# Patient Record
Sex: Female | Born: 1940
Health system: Southern US, Community
[De-identification: ages and names within clinical notes are randomized; demographics above are authoritative.]

## PROBLEM LIST (undated history)

## (undated) DIAGNOSIS — T7840XA Allergy, unspecified, initial encounter: Secondary | ICD-10-CM

## (undated) DIAGNOSIS — H269 Unspecified cataract: Secondary | ICD-10-CM

## (undated) DIAGNOSIS — M1990 Unspecified osteoarthritis, unspecified site: Secondary | ICD-10-CM

## (undated) DIAGNOSIS — Z86010 Personal history of colonic polyps: Secondary | ICD-10-CM

## (undated) DIAGNOSIS — L659 Nonscarring hair loss, unspecified: Secondary | ICD-10-CM

## (undated) DIAGNOSIS — M549 Dorsalgia, unspecified: Secondary | ICD-10-CM

## (undated) DIAGNOSIS — E785 Hyperlipidemia, unspecified: Secondary | ICD-10-CM

## (undated) HISTORY — DX: Dorsalgia, unspecified: M54.9

## (undated) HISTORY — DX: Allergy, unspecified, initial encounter: T78.40XA

## (undated) HISTORY — DX: Unspecified cataract: H26.9

## (undated) HISTORY — DX: Nonscarring hair loss, unspecified: L65.9

## (undated) HISTORY — DX: Unspecified osteoarthritis, unspecified site: M19.90

## (undated) HISTORY — DX: Hyperlipidemia, unspecified: E78.5

## (undated) HISTORY — DX: Personal history of colonic polyps: Z86.010

## (undated) HISTORY — PX: OTHER SURGICAL HISTORY: SHX169

## (undated) HISTORY — PX: WISDOM TOOTH EXTRACTION: SHX21

---

## 2000-04-18 ENCOUNTER — Other Ambulatory Visit: Admission: RE | Admit: 2000-04-18 | Discharge: 2000-04-18 | Payer: Self-pay | Admitting: Gynecology

## 2001-04-15 ENCOUNTER — Other Ambulatory Visit: Admission: RE | Admit: 2001-04-15 | Discharge: 2001-04-15 | Payer: Self-pay | Admitting: Obstetrics and Gynecology

## 2003-06-18 ENCOUNTER — Other Ambulatory Visit: Admission: RE | Admit: 2003-06-18 | Discharge: 2003-06-18 | Payer: Self-pay | Admitting: Obstetrics and Gynecology

## 2004-07-04 ENCOUNTER — Other Ambulatory Visit: Admission: RE | Admit: 2004-07-04 | Discharge: 2004-07-04 | Payer: Self-pay | Admitting: Obstetrics and Gynecology

## 2006-03-12 ENCOUNTER — Ambulatory Visit: Payer: Self-pay | Admitting: Family Medicine

## 2006-03-12 ENCOUNTER — Encounter (INDEPENDENT_AMBULATORY_CARE_PROVIDER_SITE_OTHER): Payer: Self-pay | Admitting: Specialist

## 2006-03-12 ENCOUNTER — Other Ambulatory Visit: Admission: RE | Admit: 2006-03-12 | Discharge: 2006-03-12 | Payer: Self-pay | Admitting: Family Medicine

## 2006-05-10 ENCOUNTER — Ambulatory Visit: Payer: Self-pay | Admitting: Family Medicine

## 2007-04-17 ENCOUNTER — Other Ambulatory Visit: Admission: RE | Admit: 2007-04-17 | Discharge: 2007-04-17 | Payer: Self-pay | Admitting: Gynecology

## 2008-09-23 ENCOUNTER — Ambulatory Visit: Payer: Self-pay | Admitting: Family Medicine

## 2008-09-23 DIAGNOSIS — M545 Low back pain, unspecified: Secondary | ICD-10-CM | POA: Insufficient documentation

## 2008-09-28 ENCOUNTER — Encounter: Admission: RE | Admit: 2008-09-28 | Discharge: 2008-09-28 | Payer: Self-pay | Admitting: Family Medicine

## 2010-11-13 ENCOUNTER — Encounter: Payer: Self-pay | Admitting: Family Medicine

## 2011-01-09 ENCOUNTER — Encounter: Payer: Self-pay | Admitting: Family Medicine

## 2011-01-10 ENCOUNTER — Encounter: Payer: Self-pay | Admitting: Family Medicine

## 2011-01-24 ENCOUNTER — Other Ambulatory Visit: Payer: Medicare Other | Admitting: Family Medicine

## 2011-01-31 ENCOUNTER — Ambulatory Visit (INDEPENDENT_AMBULATORY_CARE_PROVIDER_SITE_OTHER): Payer: Medicare Other | Admitting: Family Medicine

## 2011-01-31 ENCOUNTER — Encounter: Payer: Self-pay | Admitting: Family Medicine

## 2011-01-31 DIAGNOSIS — Z0000 Encounter for general adult medical examination without abnormal findings: Secondary | ICD-10-CM

## 2011-01-31 DIAGNOSIS — L259 Unspecified contact dermatitis, unspecified cause: Secondary | ICD-10-CM

## 2011-01-31 DIAGNOSIS — E785 Hyperlipidemia, unspecified: Secondary | ICD-10-CM

## 2011-01-31 LAB — CBC WITH DIFFERENTIAL/PLATELET
Basophils Absolute: 0 10*3/uL (ref 0.0–0.1)
Basophils Relative: 0.3 % (ref 0.0–3.0)
Eosinophils Absolute: 0 10*3/uL (ref 0.0–0.7)
MCHC: 34.4 g/dL (ref 30.0–36.0)
MCV: 92.9 fl (ref 78.0–100.0)
Monocytes Absolute: 0.5 10*3/uL (ref 0.1–1.0)
Neutrophils Relative %: 75.4 % (ref 43.0–77.0)
Platelets: 295 10*3/uL (ref 150.0–400.0)
RDW: 13.4 % (ref 11.5–14.6)

## 2011-01-31 LAB — HEPATIC FUNCTION PANEL
ALT: 16 U/L (ref 0–35)
Total Bilirubin: 1.1 mg/dL (ref 0.3–1.2)
Total Protein: 6.9 g/dL (ref 6.0–8.3)

## 2011-01-31 LAB — TSH: TSH: 2.35 u[IU]/mL (ref 0.35–5.50)

## 2011-01-31 LAB — POCT URINALYSIS DIPSTICK
Bilirubin, UA: NEGATIVE
Blood, UA: NEGATIVE
Glucose, UA: NEGATIVE
Nitrite, UA: NEGATIVE
Spec Grav, UA: 1.025
pH, UA: 5

## 2011-01-31 LAB — BASIC METABOLIC PANEL
BUN: 14 mg/dL (ref 6–23)
Calcium: 9.6 mg/dL (ref 8.4–10.5)
GFR: 79.34 mL/min (ref >60.00)
Glucose, Bld: 91 mg/dL (ref 70–99)
Potassium: 4.6 mEq/L (ref 3.5–5.1)
Sodium: 142 mEq/L (ref 135–145)

## 2011-01-31 LAB — LIPID PANEL
HDL: 82.3 mg/dL (ref >39.00)
Total CHOL/HDL Ratio: 3
VLDL: 16.2 mg/dL (ref 0.0–40.0)

## 2011-01-31 LAB — LDL CHOLESTEROL, DIRECT: Direct LDL: 161.3 mg/dL

## 2011-01-31 NOTE — Progress Notes (Signed)
  Subjective:    Patient ID: Tracy Zamora, female    DOB: September 30, 1941, 70 y.o.   MRN: [id number]  HPI 70 yr old female for a cpx. She has a few concerns. She has noticed some thinning of her hair, and she has had an itching rash behind both ears. She exercises regularly and tries to eat a healthy diet. No chest pain or SOB. No recent weight or appetite changes. She has not had a mammogram for several years.    Review of Systems  Constitutional: Negative.   HENT: Negative.   Eyes: Negative.   Respiratory: Negative.   Cardiovascular: Negative.   Gastrointestinal: Negative.   Genitourinary: Negative for dysuria, urgency, frequency, hematuria, flank pain, decreased urine volume, enuresis, difficulty urinating, pelvic pain and dyspareunia.  Musculoskeletal: Negative.   Skin: Negative.   Neurological: Negative.   Hematological: Negative.   Psychiatric/Behavioral: Negative.        Objective:   Physical Exam  Constitutional: She is oriented to person, place, and time. She appears well-developed and well-nourished. No distress.  HENT:  Head: Normocephalic and atraumatic.  Right Ear: External ear normal.  Left Ear: External ear normal.  Nose: Nose normal.  Mouth/Throat: Oropharynx is clear and moist. No oropharyngeal exudate.  Eyes: Conjunctivae and EOM are normal. Pupils are equal, round, and reactive to light. No scleral icterus.  Neck: Normal range of motion. Neck supple. No JVD present. No thyromegaly present.  Cardiovascular: Normal rate, regular rhythm, normal heart sounds and intact distal pulses.  Exam reveals no gallop and no friction rub.   No murmur heard.      EKG normal   Pulmonary/Chest: Effort normal and breath sounds normal. No respiratory distress. She has no wheezes. She has no rales. She exhibits no tenderness.  Abdominal: Soft. Bowel sounds are normal. She exhibits no distension and no mass. There is no tenderness. There is no rebound and no guarding.  Genitourinary:  No breast swelling, tenderness, discharge or bleeding.  Musculoskeletal: Normal range of motion. She exhibits no edema and no tenderness.  Lymphadenopathy:    She has no cervical adenopathy.  Neurological: She is alert and oriented to person, place, and time. She has normal reflexes. No cranial nerve deficit. She exhibits normal muscle tone. Coordination normal.  Skin: Skin is warm and dry. Rash noted. No erythema. No pallor.       The crease behind both ear lobes is red and scaly   Psychiatric: She has a normal mood and affect. Her behavior is normal. Judgment and thought content normal.          Assessment & Plan:  Get fasting labs. She has eczema, so I suggested she apply 1% hydrocortisone cream to these areas bid .

## 2011-02-01 LAB — VITAMIN D 25 HYDROXY (VIT D DEFICIENCY, FRACTURES): Vit D, 25-Hydroxy: 84 ng/mL (ref 30–89)

## 2011-02-06 ENCOUNTER — Telehealth: Payer: Self-pay | Admitting: Family Medicine

## 2011-02-06 NOTE — Telephone Encounter (Signed)
Dr Clent Ridges has not reviewed labs yet.

## 2011-02-06 NOTE — Telephone Encounter (Signed)
Pt needs blood work results °

## 2011-02-07 ENCOUNTER — Telehealth: Payer: Self-pay

## 2011-02-07 NOTE — Telephone Encounter (Signed)
See the report. 

## 2011-02-07 NOTE — Telephone Encounter (Signed)
Pt aware.

## 2011-02-07 NOTE — Telephone Encounter (Signed)
Message copied by Madison Hickman on Tue Feb 07, 2011  8:38 AM ------      Message from: Dwaine Deter      Created: Tue Feb 07, 2011  6:12 AM       Normal except for high chol. Watch the diet closely

## 2017-03-08 DIAGNOSIS — H52223 Regular astigmatism, bilateral: Secondary | ICD-10-CM | POA: Diagnosis not present

## 2017-03-08 DIAGNOSIS — H2513 Age-related nuclear cataract, bilateral: Secondary | ICD-10-CM | POA: Diagnosis not present

## 2017-03-08 DIAGNOSIS — H5213 Myopia, bilateral: Secondary | ICD-10-CM | POA: Diagnosis not present

## 2017-11-23 DIAGNOSIS — R1013 Epigastric pain: Secondary | ICD-10-CM | POA: Diagnosis not present

## 2017-11-23 DIAGNOSIS — R1032 Left lower quadrant pain: Secondary | ICD-10-CM | POA: Diagnosis not present

## 2017-11-23 DIAGNOSIS — R109 Unspecified abdominal pain: Secondary | ICD-10-CM | POA: Diagnosis not present

## 2017-11-27 ENCOUNTER — Encounter: Payer: Self-pay | Admitting: Family Medicine

## 2017-11-27 ENCOUNTER — Encounter: Payer: Self-pay | Admitting: Internal Medicine

## 2017-11-27 ENCOUNTER — Ambulatory Visit (INDEPENDENT_AMBULATORY_CARE_PROVIDER_SITE_OTHER): Payer: Medicare Other | Admitting: Family Medicine

## 2017-11-27 VITALS — BP 110/62 | HR 66 | Temp 98.3°F | Wt 101.4 lb

## 2017-11-27 DIAGNOSIS — R1010 Upper abdominal pain, unspecified: Secondary | ICD-10-CM | POA: Diagnosis not present

## 2017-11-27 NOTE — Progress Notes (Signed)
   Subjective:    Patient ID: Tracy Zamora, female    DOB: 04-11-1941, 77 y.o.   MRN: [id number]  HPI Here to follow up an ER visit on 11-23-17 in [place], [place]. While on vacation she woke up early that morning with the sudden onset of severe epigastric pain that then generalized over the entire  abdomen over the course of several hours. The epigastric area was always the most involved. She had normal BMs and urinations prior to that. She was nauseated but did not vomit. No fevers. At the ER her WBC was slightly elevated at 11.09 but there was no left shift. All other labs were normal. They bring copies of these results with them today. She had an abdominal CT scan as well (though we do not have a copy of that today) and she was told she had some diverticulitis. She was given IV morphine, ondansetron, and levofloxacin. She was sent out on Cipro 500 mg bid and Metronidazole 500 mg bid. Since then she has had no pain whatsoever. She still has some slight nausea but has never vomited. Still no fevers. She has developed some diarrhea in the past 24 hours and this has had a dark or black color. She is convinced the diarrhea is a side effect of the antibiotics, so she has not taken any yet today. She has never had a colonoscopy. She is eating and drinking normally.    Review of Systems  Constitutional: Negative.   Respiratory: Negative.   Cardiovascular: Negative.   Gastrointestinal: Positive for diarrhea and nausea. Negative for abdominal distention, anal bleeding, constipation and vomiting.  Genitourinary: Negative.        Objective:   Physical Exam  Constitutional: She is oriented to person, place, and time. She appears well-developed and well-nourished.  She is comfortable   Cardiovascular: Normal rate, regular rhythm, normal heart sounds and intact distal pulses.  Pulmonary/Chest: Effort normal and breath sounds normal. No respiratory distress. She has no wheezes. She has no rales.  Abdominal:  Soft. Bowel sounds are normal. She exhibits no distension and no mass. There is no tenderness. There is no rebound and no guarding.  No tenderness at all   Neurological: She is alert and oriented to person, place, and time.          Assessment & Plan:  Sudden onset of upper abdominal pain which has resolved, but with the appearance now of diarrhea. The etiology is not clear, but this scenario makes me think that diverticulitis is not a likely possibility. Other etiologies such as gastric or duodenal ulcers come to mind. We agreed to stop the antibiotics for now. She will start on Omeprazole 20 mg daily. We will attempt to have the CT scan report sent to [place]. I will refer her to GI to evaluate further.  Alysia Penna, MD

## 2017-11-28 ENCOUNTER — Ambulatory Visit: Payer: Managed Care, Other (non HMO) | Admitting: Family Medicine

## 2018-01-02 ENCOUNTER — Telehealth: Payer: Self-pay

## 2018-01-02 NOTE — Telephone Encounter (Signed)
Pt came in to office to ask for a referral to Dr. Chalmers Cater for possible thyroid issues. She is aware that she has not seen you for this complaint and may require an OV to discuss.   Dr. Sarajane Jews - Please advise. Thanks!

## 2018-01-02 NOTE — Telephone Encounter (Signed)
Please make an OV with me to discuss this

## 2018-01-03 NOTE — Telephone Encounter (Signed)
Pt advised she needs OV to discuss concerns and possible referral. She states she wcb to schedule. Nothing further needed at this time.

## 2018-01-09 ENCOUNTER — Ambulatory Visit: Payer: Medicare Other | Admitting: Gastroenterology

## 2018-01-14 ENCOUNTER — Ambulatory Visit: Payer: Medicare Other | Admitting: Internal Medicine

## 2018-01-16 ENCOUNTER — Encounter: Payer: Self-pay | Admitting: Internal Medicine

## 2018-01-16 ENCOUNTER — Ambulatory Visit (INDEPENDENT_AMBULATORY_CARE_PROVIDER_SITE_OTHER): Payer: Medicare Other | Admitting: Internal Medicine

## 2018-01-16 ENCOUNTER — Encounter (INDEPENDENT_AMBULATORY_CARE_PROVIDER_SITE_OTHER): Payer: Self-pay

## 2018-01-16 VITALS — BP 126/68 | HR 76 | Ht 61.0 in | Wt 102.4 lb

## 2018-01-16 DIAGNOSIS — Z1211 Encounter for screening for malignant neoplasm of colon: Secondary | ICD-10-CM

## 2018-01-16 DIAGNOSIS — K529 Noninfective gastroenteritis and colitis, unspecified: Secondary | ICD-10-CM | POA: Diagnosis not present

## 2018-01-16 NOTE — Patient Instructions (Signed)
  You have been scheduled for a colonoscopy. Please follow written instructions given to you at your visit today.  Please pick up your prep supplies at the pharmacy. If you use inhalers (even only as needed), please bring them with you on the day of your procedure. Your physician has requested that you go to www.[facility].com and enter the access code given to you at your visit today. This web site gives a general overview about your procedure. However, you should still follow specific instructions given to you by our office regarding your preparation for the procedure.     I appreciate the opportunity to care for you. Carl Gessner, MD, FACG 

## 2018-01-16 NOTE — Progress Notes (Signed)
Tracy Zamora 77 y.o. 05-Jun-1941 [id number]  Assessment & Plan:   Encounter Diagnoses  Name Primary?  . Gastroenteritis presumed infectious - resolved Yes  . Colon cancer screening     The signs and sxs she had at the beach and afterward sound like a resolved infectious gastroenteritis most likely. Will observe for any recurrence and consider investigation (EGD possibly) depending upon what transpires.  Screening colonoscopy is appropriate and she is willing. The risks and benefits as well as alternatives of endoscopic procedure(s) have been discussed and reviewed. All questions answered. The patient agrees to proceed.  She will call back if has any recurrent GI sxs and possibly add an EGD depending upon history.  I appreciate the opportunity to care for this patient. Cc:Fry, Ishmael Holter, MD     Subjective:   Chief Complaint: Abdominal pain and diarrhea in Feb 2019  HPI Patient is here at the request of Dr. Sarajane Jews because of an episode of abdominal pain and diarrhea that occurred in February she was visiting her second home in [place].  She was doing fine and had sudden onset of upper abdominal pain crampy fairly severe went to the ER had a CT scan that showed mild small bowel stasis in the right lower quadrant no inflammation otherwise unremarkable with normal CBC CMET and UA.  I reviewed those records.  She was prescribed Cipro and metronidazole she developed dark perhaps black diarrhea she stopped the antibiotics she saw Dr. Sarajane Jews in follow-up.  She was not taking Pepto-Bismol.  Symptoms gradually abated lasted a few days she took omeprazole for 2 weeks, at the end of that 2 weeks she went out the [place] [employer] and she had an episode of regurgitation after eating a salad it lasted a few hours, that resolved.  She took omeprazole for 2 more weeks.  She has been well since.  She has occasional heartburn.  She will use an over-the-counter antacid with relief.  She says she  thinks she is making a little more acid since her husband retired and is around the house all the time.  They do get down to [facility] for the second home every 2 weeks or so and enjoyed that.  No chronic GI issues.  She has not had a screening colonoscopy before but is willing to consider one.  She wonders if she might have an ulcer. Allergies  Allergen Reactions  . Penicillins     Causes UTI and ear infections    Current Meds  Medication Sig  . Biotin 1000 MCG tablet Take 1,000 mcg by mouth daily.  . Calcium Carb-Cholecalciferol (CALCIUM 600/VITAMIN D3) 600-800 MG-UNIT TABS Take 1 tablet by mouth daily.  . Multiple Vitamin (MULTI-VITAMIN DAILY PO) Take by mouth.   Past Medical History:  Diagnosis Date  . Alopecia   . Back pain    lumbar  . Hyperlipidemia    Past Surgical History:  Procedure Laterality Date  . uterine polypectomy    . WISDOM TOOTH EXTRACTION     Social History   Social History Narrative   The patient is married, she has 3 children.  Has a second home in [place].   2 sons one daughter   Caffeine use 1 cup of coffee 2 iced teas daily   No alcohol tobacco or drug use.   family history includes Alzheimer's disease in her mother; Prostate cancer in her father.   Review of Systems As per HPI.  All  other review of systems are negative.  Objective:   Physical Exam @BP  126/68   Pulse 76   Ht 5\' 1"  (1.549 m)   Wt 102 lb 6.4 oz (46.4 kg)   BMI 19.35 kg/m @  General:  Well-developed, well-nourished and in no acute distress Eyes:  anicteric. ENT:   Mouth and posterior pharynx free of lesions.  Neck:   supple w/o thyromegaly or mass.  Lungs: Clear to auscultation bilaterally. Heart:  S1S2, no rubs, murmurs, gallops. Abdomen:  soft, non-tender, no hepatosplenomegaly, hernia, or mass and BS+.  Rectal: Deferred until colonoscopy Lymph:  no cervical or supraclavicular adenopathy. Extremities:   no edema, cyanosis or  clubbing Skin   no rash. Neuro:  A&O x 3.  Psych:  appropriate mood and  Affect.   Data Reviewed: See HPI

## 2018-03-28 ENCOUNTER — Ambulatory Visit (INDEPENDENT_AMBULATORY_CARE_PROVIDER_SITE_OTHER): Payer: Medicare Other | Admitting: Family Medicine

## 2018-03-28 ENCOUNTER — Encounter: Payer: Self-pay | Admitting: Family Medicine

## 2018-03-28 VITALS — BP 100/76 | HR 64 | Temp 98.1°F | Ht 60.5 in | Wt 100.6 lb

## 2018-03-28 DIAGNOSIS — M25551 Pain in right hip: Secondary | ICD-10-CM

## 2018-03-28 DIAGNOSIS — Z780 Asymptomatic menopausal state: Secondary | ICD-10-CM

## 2018-03-28 DIAGNOSIS — M25552 Pain in left hip: Secondary | ICD-10-CM | POA: Diagnosis not present

## 2018-03-28 DIAGNOSIS — E785 Hyperlipidemia, unspecified: Secondary | ICD-10-CM | POA: Insufficient documentation

## 2018-03-28 DIAGNOSIS — L659 Nonscarring hair loss, unspecified: Secondary | ICD-10-CM

## 2018-03-28 DIAGNOSIS — H9190 Unspecified hearing loss, unspecified ear: Secondary | ICD-10-CM

## 2018-03-28 DIAGNOSIS — M25559 Pain in unspecified hip: Secondary | ICD-10-CM | POA: Insufficient documentation

## 2018-03-28 LAB — COMPREHENSIVE METABOLIC PANEL
ALT: 15 U/L (ref 0–35)
AST: 22 U/L (ref 0–37)
Albumin: 4.5 g/dL (ref 3.5–5.2)
Alkaline Phosphatase: 59 U/L (ref 39–117)
BUN: 12 mg/dL (ref 6–23)
CALCIUM: 9.8 mg/dL (ref 8.4–10.5)
CHLORIDE: 98 meq/L (ref 96–112)
CO2: 32 meq/L (ref 19–32)
Creatinine, Ser: 0.64 mg/dL (ref 0.40–1.20)
GFR: 96.22 mL/min (ref >60.00)
Glucose, Bld: 93 mg/dL (ref 70–99)
Potassium: 5 mEq/L (ref 3.5–5.1)
Sodium: 136 mEq/L (ref 135–145)
Total Bilirubin: 0.7 mg/dL (ref 0.2–1.2)
Total Protein: 6.8 g/dL (ref 6.0–8.3)

## 2018-03-28 LAB — CBC
HEMATOCRIT: 41.6 % (ref 36.0–46.0)
Hemoglobin: 14.1 g/dL (ref 12.0–15.0)
MCHC: 33.9 g/dL (ref 30.0–36.0)
MCV: 92.2 fl (ref 78.0–100.0)
Platelets: 330 10*3/uL (ref 150.0–400.0)
RBC: 4.51 Mil/uL (ref 3.87–5.11)
RDW: 13.4 % (ref 11.5–15.5)
WBC: 7.7 10*3/uL (ref 4.0–10.5)

## 2018-03-28 LAB — LIPID PANEL
CHOL/HDL RATIO: 3
Cholesterol: 240 mg/dL — ABNORMAL HIGH (ref 0–200)
HDL: 79.9 mg/dL (ref >39.00)
LDL CALC: 148 mg/dL — AB (ref 0–99)
NonHDL: 160.03
TRIGLYCERIDES: 62 mg/dL (ref 0.0–149.0)
VLDL: 12.4 mg/dL (ref 0.0–40.0)

## 2018-03-28 LAB — TSH: TSH: 3.28 u[IU]/mL (ref 0.35–4.50)

## 2018-03-28 LAB — T4, FREE: Free T4: 0.99 ng/dL (ref 0.60–1.60)

## 2018-03-28 LAB — T3, FREE: T3 FREE: 3.1 pg/mL (ref 2.3–4.2)

## 2018-03-28 NOTE — Addendum Note (Signed)
Addended by: Rene Kocher on: 03/28/2018 01:56 PM   Modules accepted: Orders

## 2018-03-28 NOTE — Assessment & Plan Note (Addendum)
Reports issues several years 20190 never tested. Declines testing- states wouldn't want hearing aids. Has associated tinnintus

## 2018-03-28 NOTE — Assessment & Plan Note (Addendum)
S: poorly controlled on last check. Exercises 6 days a week walking an hour in park. Eat pretty clean. Also does some weights multiple times a week Lab Results  Component Value Date   CHOL 263 (H) 01/31/2011   HDL 82.30 01/31/2011   LDLDIRECT 161.3 01/31/2011   TRIG 81.0 01/31/2011   CHOLHDL 3 01/31/2011   A/P: update lipids- get 10 year risk. Has high bad cholesterol but also very good HDL levels.  She would prefer to try to tigthen up diet even if #s are high- we will give new numbers as new starting point.

## 2018-03-28 NOTE — Assessment & Plan Note (Signed)
S: has lost all hair on legs, arms, thinned eyebrows. Biotin has helped thin fingernails. Feels like hair falls out form scalp.   Also concerned mild insomnia- actually still hot flashes from menopause, occasional anxiety. Sensitive to heat and reports being irritable. High appetite A/P: she is worried about possible thyroid issues- will update TSH at her request- very reasonable

## 2018-03-28 NOTE — Patient Instructions (Addendum)
Health Maintenance Due  Topic Date Due  . TETANUS/TDAP - Informed patient to have it done at pharmacy. 07/10/1962  . MAMMOGRAM - call the breast center and get set up for mammogram- see handout 07/10/1993  . COLONOSCOPY - Scheduled for next Tuesday June 11th, 2019 07/10/1993  . DEXA SCAN - Schedule your bone density test at check out desk. You may also call directly to X-ray at [phone] to schedule an appointment that is convenient for you.  - located [address] across the street from [place] - in the basement - you do need an appointment for the bone density tests.    07/10/2008  . PNA vac Low Risk Adult (1 of 2 - PCV13) - Patient Declined. Agrees to consider for next visit 07/10/2008   I would also like for you to sign up for an annual wellness visit with one of our nurses, Cassie or Manuela Schwartz, who both specialize in the annual wellness visit. This is a free benefit under medicare that may help [place] find additional ways to help you. Some highlights are reviewing medications, lifestyle, and doing a dementia screen.   Please stop by lab before you go. Tell lab they can release all future orders to be done today since you havent had anything to eat since 5 AM

## 2018-03-28 NOTE — Progress Notes (Signed)
Your CBC was normal (blood counts, infection fighting cells, platelets). Your CMET was normal (kidney, liver, and electrolytes, blood sugar).  Your cholesterol is high. Your 10 year risk of heart attack or stroke based on the ASCVD risk calculation is about 9%. This # uses your age, blood pressure, cholesterol, and current medications to determine risk of heart attack or stroke.  No Cholesterol medicine is recommended under 5%. Between 5-7.5% it is recommended to consider cholesterol medicine and above 7.5 % it it is recommended to start statin. I usually use at least a 10-12% cut off to suggest use as I think calculator slightly overestimates risk. The mediterranean diet can help you lower your cholesterol-there is lots of good information about this online. Your thyroid was normal on the 3 different tests- tsh, t3, t4.

## 2018-03-28 NOTE — Progress Notes (Signed)
Phone: [phone]  Subjective:  Patient presents today to establish care with me as their new primary care provider. Patient was formerly a patient of Dr. Sarajane Jews. Chief complaint-noted.   See problem oriented charting ROS-hearing loss and tenderness, occasional light sensitivity, reports both hot and cold intolerance, joint pain at times, reports thinning hair and nails  The following were reviewed and entered/updated in epic: Past Medical History:  Diagnosis Date  . Alopecia   . Back pain    lumbar. slipped disc years ago  . Hyperlipidemia    Patient Active Problem List   Diagnosis Date Noted  . Alopecia 03/28/2018    Priority: Medium  . Hyperlipidemia 03/28/2018    Priority: Medium  . Hearing loss 03/28/2018    Priority: Low  . Hip pain 03/28/2018    Priority: Low   Past Surgical History:  Procedure Laterality Date  . uterine polypectomy    . WISDOM TOOTH EXTRACTION      Family History  Problem Relation Age of Onset  . Prostate cancer Father        59  . Alzheimer's disease Mother        41  . Healthy Brother     Medications- reviewed and updated Current Outpatient Medications  Medication Sig Dispense Refill  . Biotin 1000 MCG tablet Take 1,000 mcg by mouth daily.     . Calcium Carb-Cholecalciferol (CALCIUM 600/VITAMIN D3) 600-800 MG-UNIT TABS Take 1 tablet by mouth 2 (two) times daily.     . Multiple Vitamin (MULTI-VITAMIN DAILY PO) Take by mouth.     No current facility-administered medications for this visit.     Allergies-reviewed and updated Allergies  Allergen Reactions  . Penicillins     Causes UTI and ear infections     Social History   Social History Narrative   married, she has 3 children-2 sons one daughter.  6 grandkids.    She has a second home in [place].      Homemaking.       Hobbies: big exerciser   Objective: BP 100/76 (BP Location: Left Arm, Patient Position: Sitting, Cuff Size: Normal)   Pulse 64   Temp  98.1 F (36.7 C) (Oral)   Ht 5' 0.5" (1.537 m)   Wt 100 lb 9.6 oz (45.6 kg)   SpO2 99%   BMI 19.32 kg/m  Gen: NAD, resting comfortably HEENT: Mucous membranes are moist. Oropharynx normal, mild cerumen in both canals.  Thin eyebrows with minimal hair on outer half Neck: no thyromegaly CV: RRR no murmurs rubs or gallops Lungs: CTAB no crackles, wheeze, rhonchi Abdomen: soft/nontender/nondistended/normal bowel sounds. No rebound or guarding.  Ext: no edema, no hair on extremities Skin: warm, dry Neuro: grossly normal, moves all extremities, PERRLA  Assessment/Plan:  Alopecia S: has lost all hair on legs, arms, thinned eyebrows. Biotin has helped thin fingernails. Feels like hair falls out form scalp.   Also concerned mild insomnia- actually still hot flashes from menopause, occasional anxiety. Sensitive to heat and reports being irritable. High appetite A/P: she is worried about possible thyroid issues- will update TSH at her request- very reasonable  Hyperlipidemia S: poorly controlled on last check. Exercises 6 days a week walking an hour in park. Eat pretty clean. Also does some weights multiple times a week Lab Results  Component Value Date   CHOL 263 (H) 01/31/2011   HDL 82.30 01/31/2011   LDLDIRECT 161.3 01/31/2011   TRIG 81.0 01/31/2011   CHOLHDL 3 01/31/2011  A/P: update lipids- get 10 year risk. Has high bad cholesterol but also very good HDL levels.  She would prefer to try to tigthen up diet even if #s are high- we will give new numbers as new starting point.   Hearing loss Reports issues several years 20190 never tested. Declines testing- states wouldn't want hearing aids. Has associated tinnintus   Future Appointments  Date Time Provider [place]  04/02/2018  8:00 AM Gatha Mayer, MD [facility]   Return in about 1 year (around 03/29/2019) for follow up- or sooner if needed.  Lab/Order associations: last food 5 am so over 8 hours fasting.    Alopecia - Plan: TSH, T3, free, T4, free  Hyperlipidemia, unspecified hyperlipidemia type - Plan: CBC, Comprehensive metabolic panel, Lipid panel  Hearing loss, unspecified hearing loss type, unspecified laterality  Postmenopausal - Plan: DG Bone Density  Time Stamp The duration of face-to-face time during this visit was greater than 25 minutes. Greater than 50% of this time was spent in counseling, explanation of diagnosis, planning of further management, and/or coordination of care including primarily health maintenance discussions including importance of mammogram, colonoscopy-thankful she is signed out, bone density, immunizations-she declined all immunizations at this time though..    Return precautions advised.  Garret Reddish, MD

## 2018-04-01 ENCOUNTER — Encounter: Payer: Self-pay | Admitting: Family Medicine

## 2018-04-02 ENCOUNTER — Encounter: Payer: Self-pay | Admitting: Internal Medicine

## 2018-04-02 ENCOUNTER — Ambulatory Visit (AMBULATORY_SURGERY_CENTER): Payer: Medicare Other | Admitting: Internal Medicine

## 2018-04-02 ENCOUNTER — Other Ambulatory Visit: Payer: Self-pay

## 2018-04-02 VITALS — BP 89/56 | HR 63 | Temp 98.9°F | Resp 20 | Ht 61.0 in | Wt 102.0 lb

## 2018-04-02 DIAGNOSIS — D123 Benign neoplasm of transverse colon: Secondary | ICD-10-CM

## 2018-04-02 DIAGNOSIS — K635 Polyp of colon: Secondary | ICD-10-CM | POA: Diagnosis not present

## 2018-04-02 DIAGNOSIS — Z1211 Encounter for screening for malignant neoplasm of colon: Secondary | ICD-10-CM | POA: Diagnosis not present

## 2018-04-02 MED ORDER — SODIUM CHLORIDE 0.9 % IV SOLN: 500.0000 mL | Freq: Once | INTRAVENOUS | Status: DC

## 2018-04-02 NOTE — Progress Notes (Signed)
Called to room to assist during endoscopic procedure.  Patient ID and intended procedure confirmed with present staff. Received instructions for my participation in the procedure from the performing physician.  

## 2018-04-02 NOTE — Patient Instructions (Addendum)
   I found and removed 2 tiny polyps. I will let you know pathology results and if/when to have another routine colonoscopy by mail and/or My Chart.  I appreciate the opportunity to care for you. Gatha Mayer, MD, FACG  YOU HAD AN ENDOSCOPIC PROCEDURE TODAY AT [facility]:   Refer to the procedure report that was given to you for any specific questions about what was found during the examination.  If the procedure report does not answer your questions, please call your gastroenterologist to clarify.  If you requested that your care partner not be given the details of your procedure findings, then the procedure report has been included in a sealed envelope for you to review at your convenience later.  YOU SHOULD EXPECT: Some feelings of bloating in the abdomen. Passage of more gas than usual.  Walking can help get rid of the air that was put into your GI tract during the procedure and reduce the bloating. If you had a lower endoscopy (such as a colonoscopy or flexible sigmoidoscopy) you may notice spotting of blood in your stool or on the toilet paper. If you underwent a bowel prep for your procedure, you may not have a normal bowel movement for a few days.  Please Note:  You might notice some irritation and congestion in your nose or some drainage.  This is from the oxygen used during your procedure.  There is no need for concern and it should clear up in a day or so.  SYMPTOMS TO REPORT IMMEDIATELY:   Following lower endoscopy (colonoscopy or flexible sigmoidoscopy):  Excessive amounts of blood in the stool  Significant tenderness or worsening of abdominal pains  Swelling of the abdomen that is new, acute  Fever of 100F or higher  For urgent or emergent issues, a gastroenterologist can be reached at any hour by calling [phone].   DIET:  We do recommend a small meal at first, but then you may proceed to your regular diet.  Drink plenty of fluids but you  should avoid alcoholic beverages for 24 hours.  ACTIVITY:  You should plan to take it easy for the rest of today and you should NOT DRIVE or use heavy machinery until tomorrow (because of the sedation medicines used during the test).    FOLLOW UP: Our staff will call the number listed on your records the next business day following your procedure to check on you and address any questions or concerns that you may have regarding the information given to you following your procedure. If we do not reach you, we will leave a message.  However, if you are feeling well and you are not experiencing any problems, there is no need to return our call.  We will assume that you have returned to your regular daily activities without incident.  If any biopsies were taken you will be contacted by phone or by letter within the next 1-3 weeks.  Please call us at [phone] if you have not heard about the biopsies in 3 weeks.   Await for biopsy results Polyps (handout given)   SIGNATURES/CONFIDENTIALITY: You and/or your care partner have signed paperwork which will be entered into your electronic medical record.  These signatures attest to the fact that that the information above on your After Visit Summary has been reviewed and is understood.  Full responsibility of the confidentiality of this discharge information lies with you and/or your care-partner.

## 2018-04-02 NOTE — Op Note (Signed)
[facility] Patient Name: Tracy Zamora Procedure Date: 04/02/2018 8:05 AM MRN: [id number] Endoscopist: Gatha Mayer , MD Age: 77 Referring MD:  Date of Birth: 20-Jul-1941 Gender: Female Account #: [id number] Procedure:                Colonoscopy Indications:              Screening for colorectal malignant neoplasm Medicines:                Propofol per Anesthesia, Monitored Anesthesia Care Procedure:                Pre-Anesthesia Assessment:                           - Prior to the procedure, a History and Physical                            was performed, and patient medications and                            allergies were reviewed. The patient's tolerance of                            previous anesthesia was also reviewed. The risks                            and benefits of the procedure and the sedation                            options and risks were discussed with the patient.                            All questions were answered, and informed consent                            was obtained. Prior Anticoagulants: The patient has                            taken no previous anticoagulant or antiplatelet                            agents. ASA Grade Assessment: II - A patient with                            mild systemic disease. After reviewing the risks                            and benefits, the patient was deemed in                            satisfactory condition to undergo the procedure.                           After obtaining informed consent, the colonoscope  was passed under direct vision. Throughout the                            procedure, the patient's blood pressure, pulse, and                            oxygen saturations were monitored continuously. The                            Colonoscope was introduced through the anus and                            advanced to the the cecum, identified by   appendiceal orifice and ileocecal valve. The                            colonoscopy was performed without difficulty. The                            patient tolerated the procedure well. The quality                            of the bowel preparation was good. The appendiceal                            orifice and the rectum were photographed. The bowel                            preparation used was Miralax. Scope In: 8:20:54 AM Scope Out: 8:36:36 AM Scope Withdrawal Time: 0 hours 12 minutes 24 seconds  Total Procedure Duration: 0 hours 15 minutes 42 seconds  Findings:                 The perianal and digital rectal examinations were                            normal.                           Two flat polyps were found in the transverse colon.                            The polyps were diminutive in size. These polyps                            were removed with a cold snare. Resection and                            retrieval were complete. Verification of patient                            identification for the specimen was done. Estimated                            blood loss was  minimal.                           The exam was otherwise without abnormality on                            direct and retroflexion views. Complications:            No immediate complications. Estimated Blood Loss:     Estimated blood loss was minimal. Impression:               - Two diminutive polyps in the transverse colon,                            removed with a cold snare. Resected and retrieved.                           - The examination was otherwise normal on direct                            and retroflexion views. Recommendation:           - Patient has a contact number available for                            emergencies. The signs and symptoms of potential                            delayed complications were discussed with the                            patient. Return to normal activities  tomorrow.                            Written discharge instructions were provided to the                            patient.                           - Resume previous diet.                           - Continue present medications.                           - Await pathology results.                           - Repeat colonoscopy may be recommended. The                            colonoscopy date will be determined after pathology                            results from today's exam become available for  review. Gatha Mayer, MD 04/02/2018 8:45:22 AM This report has been signed electronically.

## 2018-04-03 ENCOUNTER — Telehealth: Payer: Self-pay | Admitting: *Deleted

## 2018-04-03 NOTE — Telephone Encounter (Signed)
  Follow up Call-  Call back number 04/02/2018  Post procedure Call Back phone  # [phone]  Permission to leave phone message Yes  Some recent data might be hidden     Patient questions:  Do you have a fever, pain , or abdominal swelling? No. Pain Score  0 *  Have you tolerated food without any problems? Yes.    Have you been able to return to your normal activities? Yes.    Do you have any questions about your discharge instructions: Diet   No. Medications  No. Follow up visit  No.  Do you have questions or concerns about your Care? No.  Actions: * If pain score is 4 or above: No action needed, pain <4.

## 2018-04-03 NOTE — Telephone Encounter (Signed)
  Follow up Call-  Call back number 04/02/2018  Post procedure Call Back phone  # [phone]  Permission to leave phone message Yes  Some recent data might be hidden     Patient questions:  Do you have a fever, pain , or abdominal swelling? No. Pain Score  0 *  Have you tolerated food without any problems? Yes.    Have you been able to return to your normal activities? Yes.    Do you have any questions about your discharge instructions: Diet   No. Medications  No. Follow up visit  No.  Do you have questions or concerns about your Care? No.  Actions: * If pain score is 4 or above: No action needed, pain <4.

## 2018-04-07 ENCOUNTER — Encounter: Payer: Self-pay | Admitting: Internal Medicine

## 2018-04-07 DIAGNOSIS — Z860100 Personal history of colon polyps, unspecified: Secondary | ICD-10-CM | POA: Insufficient documentation

## 2018-04-07 HISTORY — DX: Personal history of colon polyps, unspecified: Z86.0100

## 2018-04-07 HISTORY — DX: Personal history of colonic polyps: Z86.010

## 2018-04-18 DIAGNOSIS — H2513 Age-related nuclear cataract, bilateral: Secondary | ICD-10-CM | POA: Diagnosis not present

## 2018-04-18 DIAGNOSIS — H5213 Myopia, bilateral: Secondary | ICD-10-CM | POA: Diagnosis not present

## 2018-04-18 DIAGNOSIS — H52223 Regular astigmatism, bilateral: Secondary | ICD-10-CM | POA: Diagnosis not present

## 2018-04-18 DIAGNOSIS — H524 Presbyopia: Secondary | ICD-10-CM | POA: Diagnosis not present

## 2018-05-13 DIAGNOSIS — L820 Inflamed seborrheic keratosis: Secondary | ICD-10-CM | POA: Diagnosis not present

## 2019-01-16 ENCOUNTER — Telehealth: Payer: Self-pay

## 2019-01-16 NOTE — Telephone Encounter (Signed)
Thanks for update- make sure they go in - check tomorrow

## 2019-01-16 NOTE — Telephone Encounter (Signed)
Noted  

## 2019-01-16 NOTE — Telephone Encounter (Signed)
FYI  Pt husband has been recovering from Bronchitis and was tested Covid-19 but test was negative. Pt husband stated that his wife has 102 temp with head and chest pain congestion. Pt denies SOB. Pt stated that she does have body aches, muscles pains, body aches and runny nose fatigue. Pt has been coughing with chills.   Per dr. Jerline Pain and protocol pt was advised to go to the ED.  Pt husband was upset and stated that we saw him for the same issue recently and he came to the office. Pt husband was advised that we no longer have PPE for protection so we are not seeing any sick pt in the office. Pt was advised again to go to the ED. Pt verbalized understanding.

## 2019-01-17 NOTE — Telephone Encounter (Signed)
Given reported chest pain- ER is appropriate. Since she refuses- may offer her a webex.

## 2019-01-17 NOTE — Telephone Encounter (Signed)
Pt stated that she did not go to the emergency room. She stated that she still does have a productive cough with yellow phlegm and still has a fever. Pt is wanting antibiotics for the issue. I advised pt that antibiotics cannot be prescribed without assessment. Pt stated that her husband was prescribed antibiotics for his issue and he had the same sx. Pt was informed that her husband was seen for his sx. Pt stated that she does not want to go to the ER due to past  experiences that wasn't good. Pt also articulated that she is trying to avoid catching Corona virus. Pt was advised to seek help but declined ED visit. Pt husband is wanting pt to come to the office and I advised again that we do not have proper PPE.  Pt had red flags on triage note that's why pt was advised to seek help immediately.   Will a Webex visit be appropriate since pt is declining ED visit?   Please advise

## 2019-01-18 ENCOUNTER — Telehealth: Payer: Self-pay | Admitting: Family Medicine

## 2019-01-18 NOTE — Telephone Encounter (Signed)
Pt 's daughter called and requesting antibiotic to be called in for her mother, she said that her mother is fearful of going to the ed and don't want to virtual visit , she wants would like an rx called the same rx as her father sent for mother.

## 2019-01-20 NOTE — Telephone Encounter (Signed)
See request °

## 2019-05-23 ENCOUNTER — Other Ambulatory Visit: Payer: Self-pay

## 2019-10-27 ENCOUNTER — Telehealth: Payer: Self-pay | Admitting: Family Medicine

## 2019-10-27 NOTE — Telephone Encounter (Signed)
I left a message asking the patient and spouse to call and schedule Medicare AWV with Courtney ([facility]).  If patient calls back, please schedule Medicare Wellness Visit (initial) at next available opening.  VDM (Dee-Dee) °

## 2019-12-22 ENCOUNTER — Ambulatory Visit: Payer: Medicare Other | Attending: Internal Medicine

## 2019-12-22 DIAGNOSIS — Z23 Encounter for immunization: Secondary | ICD-10-CM

## 2019-12-22 NOTE — Progress Notes (Signed)
   Covid-19 Vaccination Clinic  Name:  Journye Winchester    MRN: [id number] DOB: 1941-07-26  12/22/2019  Ms. Cena was observed post Covid-19 immunization for 15 minutes without incidence. She was provided with Vaccine Information Sheet and instruction to access the V-Safe system.   Ms. Allison was instructed to call 911 with any severe reactions post vaccine: Marland Kitchen Difficulty breathing  . Swelling of your face and throat  . A fast heartbeat  . A bad rash all over your body  . Dizziness and weakness    Immunizations Administered    Name Date Dose VIS Date Route   Pfizer COVID-19 Vaccine 12/22/2019 12:08 PM 0.3 mL 10/03/2019 Intramuscular   Manufacturer: [place]   Lot: [id number]   [place]: [id number]

## 2020-01-20 ENCOUNTER — Ambulatory Visit: Payer: Medicare Other | Attending: Internal Medicine

## 2020-01-20 DIAGNOSIS — Z23 Encounter for immunization: Secondary | ICD-10-CM

## 2020-01-20 NOTE — Progress Notes (Signed)
   Covid-19 Vaccination Clinic  Name:  Nissa Campopiano    MRN: [id number] DOB: 10/27/1940  01/20/2020  Ms. Depies was observed post Covid-19 immunization for 15 minutes without incident. She was provided with Vaccine Information Sheet and instruction to access the V-Safe system.   Ms. Demby was instructed to call 911 with any severe reactions post vaccine: Marland Kitchen Difficulty breathing  . Swelling of face and throat  . A fast heartbeat  . A bad rash all over body  . Dizziness and weakness   Immunizations Administered    Name Date Dose VIS Date Route   Pfizer COVID-19 Vaccine 01/20/2020 12:00 PM 0.3 mL 10/03/2019 Intramuscular   Manufacturer: [place]   Lot: [id number]   [place]: [id number]

## 2020-01-27 DIAGNOSIS — H2513 Age-related nuclear cataract, bilateral: Secondary | ICD-10-CM | POA: Diagnosis not present

## 2020-01-27 DIAGNOSIS — H52223 Regular astigmatism, bilateral: Secondary | ICD-10-CM | POA: Diagnosis not present

## 2020-01-27 DIAGNOSIS — H5213 Myopia, bilateral: Secondary | ICD-10-CM | POA: Diagnosis not present

## 2020-01-27 DIAGNOSIS — H524 Presbyopia: Secondary | ICD-10-CM | POA: Diagnosis not present

## 2020-01-30 ENCOUNTER — Telehealth: Payer: Self-pay | Admitting: Family Medicine

## 2020-01-30 NOTE — Telephone Encounter (Signed)
I left a message asking the pt to call and schedule AWV w/ Loma Sousa and CPE w/ Dr. Yong Channel.

## 2020-02-03 NOTE — Telephone Encounter (Signed)
Pt returned call and scheduled physical & MWV

## 2020-03-10 NOTE — Progress Notes (Signed)
Phone: [phone]    Subjective:  Patient presents today for their annual wellness visit (initial )  Preventive Screening-Counseling & Management  Modifiable Risk Factors/behavioral risk assessment/psychosocial risk assessment Regular exercise: walks 6 days a week for 1hr 5 minutes at park and strength training for about an hour a day at home Diet: reasonably healthy diet- would actually like for her to increase  Wt Readings from Last 3 Encounters:  03/12/20 101 lb (45.8 kg)  04/02/18 102 lb (46.3 kg)  03/28/18 100 lb 9.6 oz (45.6 kg)   Smoking Status: Never Smoker Second Hand Smoking status: No smokers in home Alcohol intake: 0 per week  Cardiac risk factors:  advanced age (older than 32 for men, 76 for women)  Untreated Hyperlipidemia  No Hypertension  No diabetes.  No results found for: HGBA1C Family History: no family history of CAD. Dad did have strok in high 65s  Family History  Problem Relation Age of Onset  . Prostate cancer Father        76  . Stroke Father        mild stroke late 69s  . Alzheimer's disease Mother        73  . Healthy Brother   . Colon cancer Neg Hx   . Esophageal cancer Neg Hx   . Rectal cancer Neg Hx   . Stomach cancer Neg Hx    Depression Screen/risk evaluation Risk factors: none.Marland Kitchen PHQ2 0  Depression screen [facility] 2/9 03/12/2020 03/28/2018  Decreased Interest 0 0  Down, Depressed, Hopeless 0 0  PHQ - 2 Score 0 0  Altered sleeping 0 -  Tired, decreased energy 0 -  Change in appetite 0 -  Feeling bad or failure about yourself  0 -  Trouble concentrating 0 -  Moving slowly or fidgety/restless 0 -  Suicidal thoughts 0 -  PHQ-9 Score 0 -  Difficult doing work/chores Not difficult at all -    Functional ability and level of safety Mobility assessment:  timed get up and go <12 seconds Activities of Daily Living- Independent in ADLs (toileting, bathing, dressing, transferring, eating) and in IADLs (shopping, housekeeping, managing own  medications, and handling finances) Home Safety: Loose rugs (some- advised to consider removing), smoke detectors (up to date), small pets (no), grab bars (no- advised to consider), stairs (one flight 16 steps- no issues), life-alert system (would use cell phone) Hearing Difficulties: -patient endorses.  Does not bother her enough to want to get tested-offered referral for audiology she declines for now Fall Risk: None  Fall Risk  03/12/2020 05/23/2019 03/28/2018  Falls in the past year? 0 (No Data) No  Comment - Emmi Telephone Survey: data to providers prior to load -  Number falls in past yr: 0 (No Data) -  Comment - Emmi Telephone Survey Actual Response =  -  Injury with Fall? 0 - -  Opioid use history:  no long term opioids use Self assessment of health status: "good to excellent"  Cognitive Testing             No reported trouble.   Mini cog: normal clock draw. 3/3 delayed recall. Normal test result   List the Names of Other Physician/Practitioners you currently use: Patient Care Team: Marin Olp, MD as PCP - General (Family Medicine) Delsa Sale, OD (Optometry) Gatha Mayer, MD as Consulting Physician (Gastroenterology)  Required Immunizations needed today:   Pneumovax 23 today. Consider Tdap at [employer] History  Administered Date(s) Administered  .  PFIZER SARS-COV-2 Vaccination 12/22/2019, 01/20/2020   Health Maintenance  Topic Date Due  . Tetanus Vaccine  Never done  . DEXA scan (bone density measurement)  Never done  . Pneumonia vaccines (1 of 2 - PCV13) Never done  . Flu Shot  05/23/2020  . COVID-19 Vaccine  Completed    Screening tests-order placed for bone density with [place]. 1. Colon cancer screening- 03/2018 2 diminuitive polyps- Dr. Carlean Purl said no recall due to age 10. Lung Cancer screening- not needed- never smoker 3. Skin cancer screening- sees dermatology occasionally- recommended scheduling follow up 4. Cervical cancer screening- no  history recent abnormal pap smear- aged out of repeat 5. Breast cancer screening- scheduled for mammogram. Does not do self exams.   The following were reviewed and entered/updated in epic if appropriate: Past Medical History:  Diagnosis Date  . Alopecia   . Back pain    lumbar. slipped disc years ago  . Hx of colonic polyps 04/07/2018  . Hyperlipidemia    Patient Active Problem List   Diagnosis Date Noted  . Senile purpura ([place]) 03/12/2020    Priority: Medium  . Alopecia 03/28/2018    Priority: Medium  . Hyperlipidemia 03/28/2018    Priority: Medium  . Hx of colonic polyps 04/07/2018    Priority: Low  . Hearing loss 03/28/2018    Priority: Low  . Hip pain 03/28/2018    Priority: Low   Past Surgical History:  Procedure Laterality Date  . uterine polypectomy    . WISDOM TOOTH EXTRACTION      Family History  Problem Relation Age of Onset  . Prostate cancer Father        72  . Stroke Father        mild stroke late 6s  . Alzheimer's disease Mother        82  . Healthy Brother   . Colon cancer Neg Hx   . Esophageal cancer Neg Hx   . Rectal cancer Neg Hx   . Stomach cancer Neg Hx     Medications- reviewed and updated Current Outpatient Medications  Medication Sig Dispense Refill  . Biotin 1000 MCG tablet Take 1,000 mcg by mouth daily.     . Calcium Carb-Cholecalciferol (CALCIUM 600/VITAMIN D3) 600-800 MG-UNIT TABS Take 1 tablet by mouth 2 (two) times daily.     . Multiple Vitamin (MULTI-VITAMIN DAILY PO) Take by mouth.     Current Facility-Administered Medications  Medication Dose Route Frequency Provider Last Rate Last Admin  . 0.9 %  sodium chloride infusion  500 mL Intravenous Once Gatha Mayer, MD        Allergies-reviewed and updated Allergies  Allergen Reactions  . Penicillins     Causes UTI and ear infections     Social History   Socioeconomic History  . Marital status: Married    Spouse name: Not on file  . Number of children: 3  . Years  of education: Not on file  . Highest education level: Not on file  Occupational History  . Not on file  Tobacco Use  . Smoking status: Never Smoker  . Smokeless tobacco: Never Used  Substance and Sexual Activity  . Alcohol use: No  . Drug use: No  . Sexual activity: Not on file  Other Topics Concern  . Not on file  Social History Narrative   married, she has 3 children-2 sons one daughter.  6 grandkids.    She has a second home in [place].      Homemaking.       Hobbies: big exerciser   Social Determinants of Health   Financial Resource Strain:   . Difficulty of Paying Living Expenses:   Food Insecurity:   . Worried About Charity fundraiser in the Last Year:   . Arboriculturist in the Last Year:   Transportation Needs:   . Film/video editor (Medical):   Marland Kitchen Lack of Transportation (Non-Medical):   Physical Activity:   . Days of Exercise per Week:   . Minutes of Exercise per Session:   Stress:   . Feeling of Stress :   Social Connections:   . Frequency of Communication with Friends and Family:   . Frequency of Social Gatherings with Friends and Family:   . Attends Religious Services:   . Active Member of Clubs or Organizations:   . Attends Archivist Meetings:   Marland Kitchen Marital Status:       Objective:  BP 100/60   Pulse 64   Temp (!) 97 F (36.1 C) (Temporal)   Ht 5\' 1"  (1.549 m)   Wt 101 lb (45.8 kg)   SpO2 100%   BMI 19.08 kg/m  Gen: NAD, resting comfortably HEENT: Mucous membranes are moist. Oropharynx normal Neck: no thyromegaly CV: RRR no murmurs rubs or gallops Lungs: CTAB no crackles, wheeze, rhonchi Abdomen: soft/nontender/nondistended/normal bowel sounds. No rebound or guarding.  Ext: no edema Skin: warm, dry Neuro: grossly normal, moves all extremities, PERRLA   Assessment/Plan:  AWV completed 1. Educated, counseled and referred based on above elements 2. Educated, counseled and referred as appropriate for  preventative needs 3. Discussed and documented a written plan for preventiative services and screenings with personalized health advice- After Visit Summary was given to patient which included this plan   Status of chronic or acute concerns    #hyperlipidemia S: Medication:none  Lab Results  Component Value Date   CHOL 240 (H) 03/28/2018   HDL 79.90 03/28/2018   LDLCALC 148 (H) 03/28/2018   LDLDIRECT 161.3 01/31/2011   TRIG 62.0 03/28/2018   CHOLHDL 3 03/28/2018   A/P: 10-year ASCVD risk score of 11.7%.  We discussed above 7.5% current recommendations state to consider cholesterol medication.  She prefers to minimize medications if at all possible and would like to use 20% cut off before starting medication  #Urinary frequency-states she thinks this is age related- declines UA   # senile purpura- easy bruising on extremities. She wants to make sure CBC ok to evaluate possible leukemia  # right sided nasal congestion and runny nose states year round. Recommended trial of flonase over the counter for at least 2-4 weeks to see if that helps  # 2 years ago had an ER trip with bad pain in her abdomen and some dry heaves- since that time has had intermittent issues with right lower throat pain. Later had some diarrhea.  She saw Dr. Carlean Purl January 16, 2018 from referral from Dr. Mohammed Kindle thought this was primarily resolved gastroenteritis.  If she gets upset notes abdominal pain. Also with burping may feel some pain in right lower throat. advised trial of omeprazole 20mg  over the counter for 1 month as she is concerned about possible acid. If that is not helpfu, She is going to call Dr. Carlean Purl for follow up and possible EGD  Recommended follow up: Return in about 1 year (around 03/12/2021) for annual wellness visit or sooner if needed. Future Appointments  Date Time Provider [place]  03/16/2020  1:00 PM [facility] MM 2 [facility]     Lab/Order associations:   ICD-10-CM   1.  Preventative health care  Z00.00   2. Hyperlipidemia, unspecified hyperlipidemia type  E78.5 Comprehensive metabolic panel    CBC with Differential/Platelet    Lipid panel  3. Postmenopause  Z78.0 DG Bone Density  4. Senile purpura ([place])  D69.2    Return precautions advised.  Garret Reddish, MD

## 2020-03-11 ENCOUNTER — Other Ambulatory Visit: Payer: Self-pay | Admitting: Family Medicine

## 2020-03-11 DIAGNOSIS — Z1231 Encounter for screening mammogram for malignant neoplasm of breast: Secondary | ICD-10-CM

## 2020-03-12 ENCOUNTER — Ambulatory Visit: Payer: Medicare Other

## 2020-03-12 ENCOUNTER — Ambulatory Visit (INDEPENDENT_AMBULATORY_CARE_PROVIDER_SITE_OTHER): Payer: Medicare Other | Admitting: Family Medicine

## 2020-03-12 ENCOUNTER — Other Ambulatory Visit: Payer: Self-pay

## 2020-03-12 ENCOUNTER — Encounter: Payer: Self-pay | Admitting: Family Medicine

## 2020-03-12 VITALS — BP 100/60 | HR 64 | Temp 97.0°F | Ht 61.0 in | Wt 101.0 lb

## 2020-03-12 DIAGNOSIS — Z0000 Encounter for general adult medical examination without abnormal findings: Secondary | ICD-10-CM

## 2020-03-12 DIAGNOSIS — E785 Hyperlipidemia, unspecified: Secondary | ICD-10-CM | POA: Diagnosis not present

## 2020-03-12 DIAGNOSIS — Z780 Asymptomatic menopausal state: Secondary | ICD-10-CM

## 2020-03-12 DIAGNOSIS — Z23 Encounter for immunization: Secondary | ICD-10-CM | POA: Diagnosis not present

## 2020-03-12 DIAGNOSIS — D692 Other nonthrombocytopenic purpura: Secondary | ICD-10-CM | POA: Insufficient documentation

## 2020-03-12 NOTE — Addendum Note (Signed)
Addended by: Christiana Fuchs on: 03/12/2020 04:00 PM   Modules accepted: Orders

## 2020-03-12 NOTE — Patient Instructions (Addendum)
Health Maintenance Due  Topic Date Due  . TETANUS/TDAP Will ask about at pharmacy - wait at least 2 weeks from pneumovax 23 Never done  . DEXA SCAN   Schedule your bone density test at check out desk. You may also call directly to X-ray at [phone] to schedule an appointment that is convenient for you.  - located [address] across the street from [place] - in the basement - you do need an appointment for the bone density tests.    Never done  . Pneumovax 23 today Never done   Please stop by lab before you go If you have mychart- we will send your results within 3 business days of [place] receiving them.  If you do not have mychart- we will call you about results within 5 business days of [place] receiving them.     Ms. Mctee , Thank you for taking time to come for your Medicare Wellness Visit. I appreciate your ongoing commitment to your health goals. Please review the following plan we discussed and let me know if I can assist you in the future.   These are the goals we discussed: 1. Keep up the great  Job with exercise 2. # right sided nasal congestion and runny nose states year round. Recommended trial of flonase over the counter for at least 2-4 weeks to see if that helps -could also try nelmed sinus rinse or neti pot or other similar product 3. advised trial of omeprazole or prilosec 20mg  over the counter for 1 month as she is concerned about possible acid. If that is not helpfu, She is going to call Dr. Carlean Purl for follow up and possible EGD  This is a list of the screening recommended for you and due dates:  Health Maintenance  Topic Date Due  . Tetanus Vaccine  Never done  . DEXA scan (bone density measurement)  Never done  . Pneumonia vaccines (1 of 2 - PCV13) Never done  . Flu Shot  05/23/2020  . COVID-19 Vaccine  Completed     Recommended follow up: Return in about 1 year (around 03/12/2021) for annual wellness visit or sooner if needed.

## 2020-03-12 NOTE — Addendum Note (Signed)
Addended by: Clyde Lundborg A on: 03/12/2020 04:00 PM   Modules accepted: Orders

## 2020-03-13 LAB — CBC WITH DIFFERENTIAL/PLATELET
Absolute Monocytes: 745 cells/uL (ref 200–950)
Basophils Absolute: 32 cells/uL (ref 0–200)
Basophils Relative: 0.4 %
Eosinophils Absolute: 57 cells/uL (ref 15–500)
Eosinophils Relative: 0.7 %
HCT: 43.8 % (ref 35.0–45.0)
Hemoglobin: 14.7 g/dL (ref 11.7–15.5)
Lymphs Abs: 1936 cells/uL (ref 850–3900)
MCH: 31.1 pg (ref 27.0–33.0)
MCHC: 33.6 g/dL (ref 32.0–36.0)
MCV: 92.6 fL (ref 80.0–100.0)
MPV: 9.5 fL (ref 7.5–12.5)
Monocytes Relative: 9.2 %
Neutro Abs: 5330 cells/uL (ref 1500–7800)
Neutrophils Relative %: 65.8 %
Platelets: 302 10*3/uL (ref 140–400)
RBC: 4.73 10*6/uL (ref 3.80–5.10)
RDW: 12.1 % (ref 11.0–15.0)
Total Lymphocyte: 23.9 %
WBC: 8.1 10*3/uL (ref 3.8–10.8)

## 2020-03-13 LAB — LIPID PANEL
Cholesterol: 222 mg/dL — ABNORMAL HIGH (ref <200)
HDL: 87 mg/dL (ref >=50)
LDL Cholesterol (Calc): 120 mg/dL (calc) — ABNORMAL HIGH
Non-HDL Cholesterol (Calc): 135 mg/dL (calc) — ABNORMAL HIGH (ref <130)
Total CHOL/HDL Ratio: 2.6 (calc) (ref <5.0)
Triglycerides: 54 mg/dL (ref <150)

## 2020-03-13 LAB — COMPREHENSIVE METABOLIC PANEL
AG Ratio: 2.1 (calc) (ref 1.0–2.5)
ALT: 15 U/L (ref 6–29)
AST: 24 U/L (ref 10–35)
Albumin: 4.4 g/dL (ref 3.6–5.1)
Alkaline phosphatase (APISO): 65 U/L (ref 37–153)
BUN: 13 mg/dL (ref 7–25)
CO2: 29 mmol/L (ref 20–32)
Calcium: 9.9 mg/dL (ref 8.6–10.4)
Chloride: 97 mmol/L — ABNORMAL LOW (ref 98–110)
Creat: 0.72 mg/dL (ref 0.60–0.93)
Globulin: 2.1 g/dL (calc) (ref 1.9–3.7)
Glucose, Bld: 97 mg/dL (ref 65–99)
Potassium: 5 mmol/L (ref 3.5–5.3)
Sodium: 136 mmol/L (ref 135–146)
Total Bilirubin: 0.7 mg/dL (ref 0.2–1.2)
Total Protein: 6.5 g/dL (ref 6.1–8.1)

## 2020-03-16 ENCOUNTER — Other Ambulatory Visit: Payer: Self-pay

## 2020-03-16 ENCOUNTER — Ambulatory Visit
Admission: RE | Admit: 2020-03-16 | Discharge: 2020-03-16 | Disposition: A | Discharge status: Home / Self Care | Payer: Medicare Other | Source: Ambulatory Visit | Attending: Family Medicine | Admitting: Family Medicine

## 2020-03-16 DIAGNOSIS — Z1231 Encounter for screening mammogram for malignant neoplasm of breast: Secondary | ICD-10-CM

## 2020-03-23 ENCOUNTER — Other Ambulatory Visit: Payer: Self-pay

## 2020-03-23 ENCOUNTER — Ambulatory Visit (INDEPENDENT_AMBULATORY_CARE_PROVIDER_SITE_OTHER)
Admission: RE | Admit: 2020-03-23 | Discharge: 2020-03-23 | Disposition: A | Discharge status: Home / Self Care | Payer: Medicare Other | Source: Ambulatory Visit | Attending: Family Medicine | Admitting: Family Medicine

## 2020-03-23 DIAGNOSIS — Z780 Asymptomatic menopausal state: Secondary | ICD-10-CM

## 2020-03-29 ENCOUNTER — Encounter: Payer: Self-pay | Admitting: Family Medicine

## 2020-03-29 ENCOUNTER — Other Ambulatory Visit: Payer: Self-pay

## 2020-03-29 DIAGNOSIS — M810 Age-related osteoporosis without current pathological fracture: Secondary | ICD-10-CM | POA: Insufficient documentation

## 2020-03-29 MED ORDER — ALENDRONATE SODIUM 70 MG PO TABS
70.0000 mg | ORAL_TABLET | ORAL | 3 refills | Status: DC
Start: 2020-03-29 — End: 2021-10-10

## 2020-08-24 IMAGING — MG DIGITAL SCREENING BILAT W/ TOMO W/ CAD
8 series · 9 of 24 positions shown · non-contrast
Comparison: None.

CLINICAL DATA: Screening.

EXAM:
DIGITAL SCREENING BILATERAL MAMMOGRAM WITH TOMO AND CAD

[R MLO synth-2D]
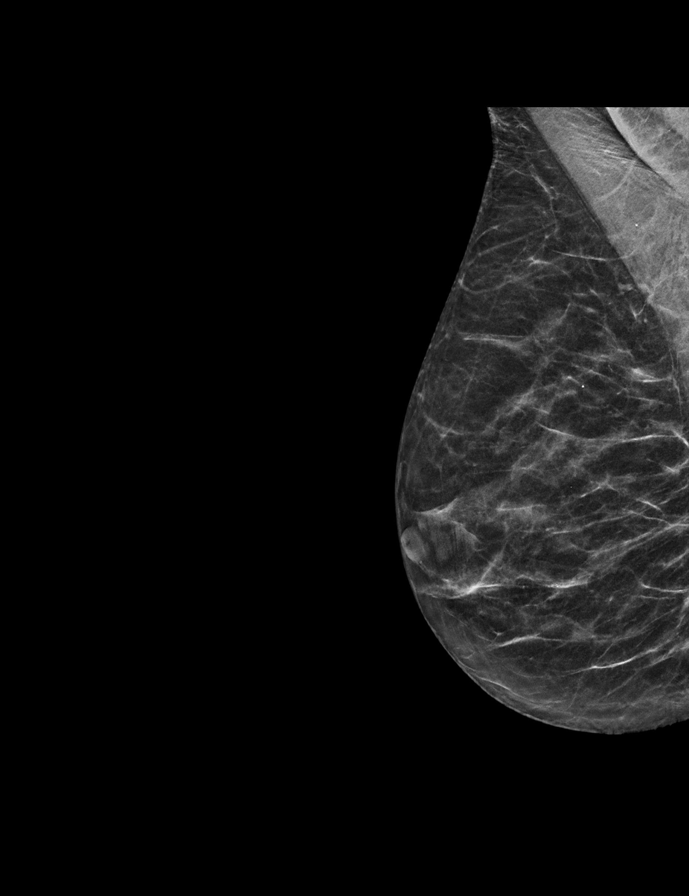

[L MLO synth-2D]
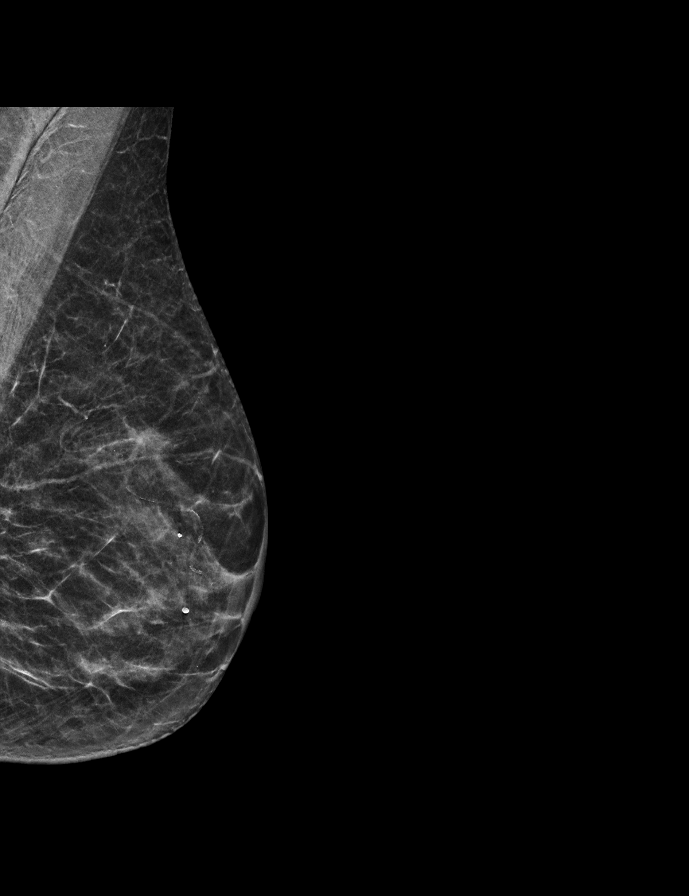

[R CC synth-2D]
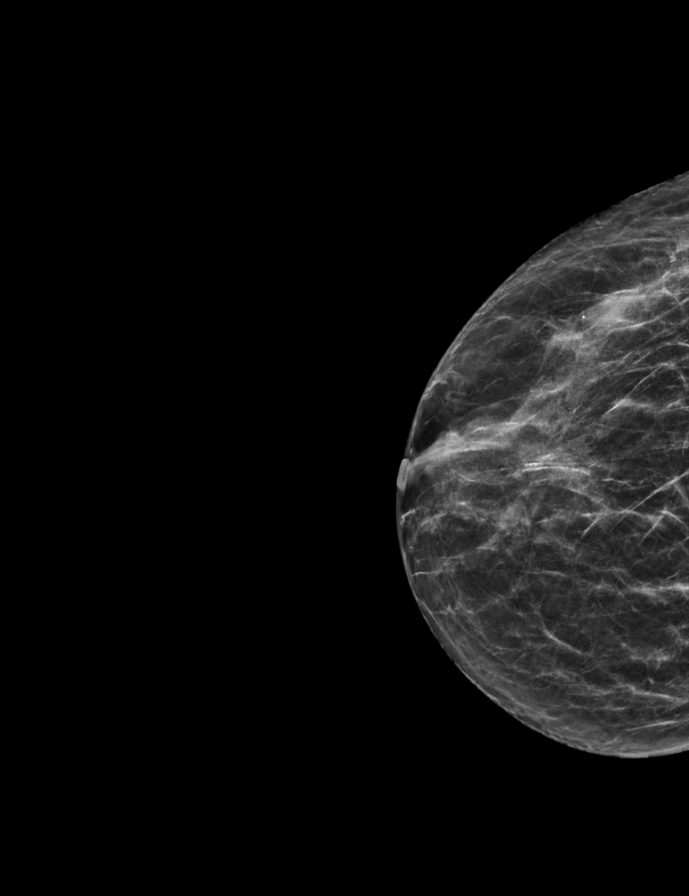

[L CC synth-2D]
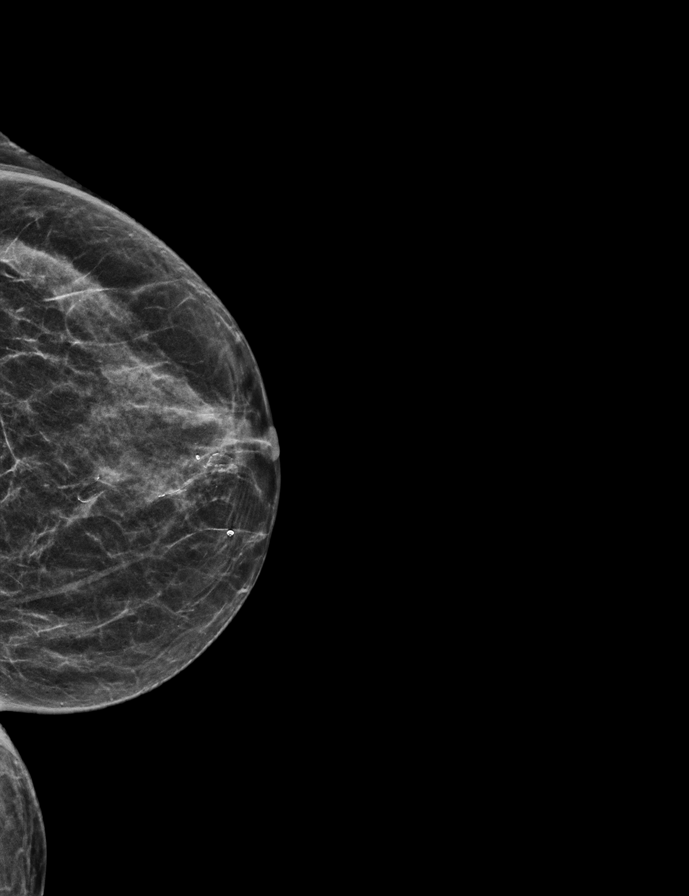

[L MLO tomo · 2 of 44 frames shown]
[frame 15/44]
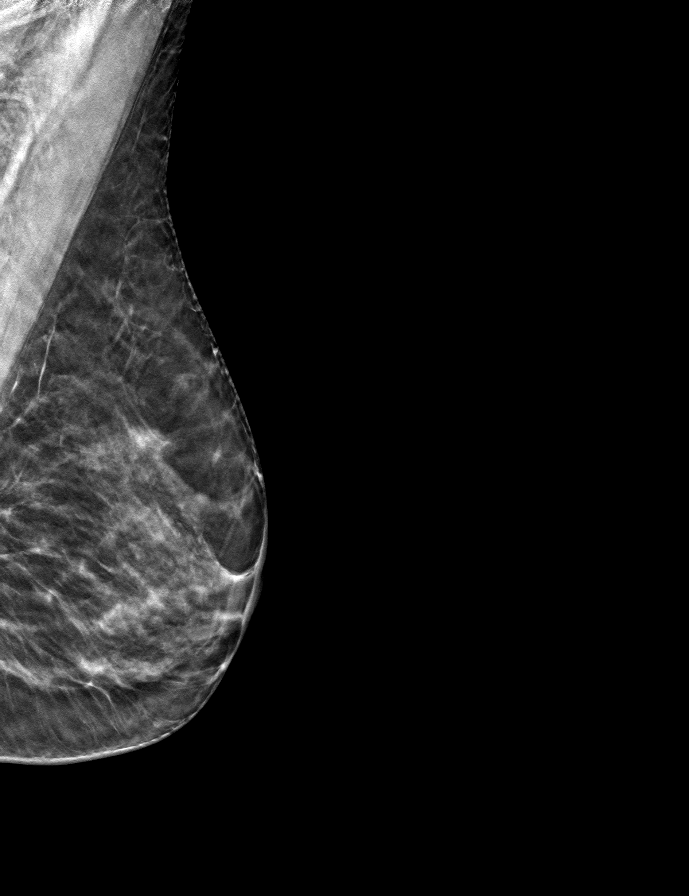
[frame 23/44]
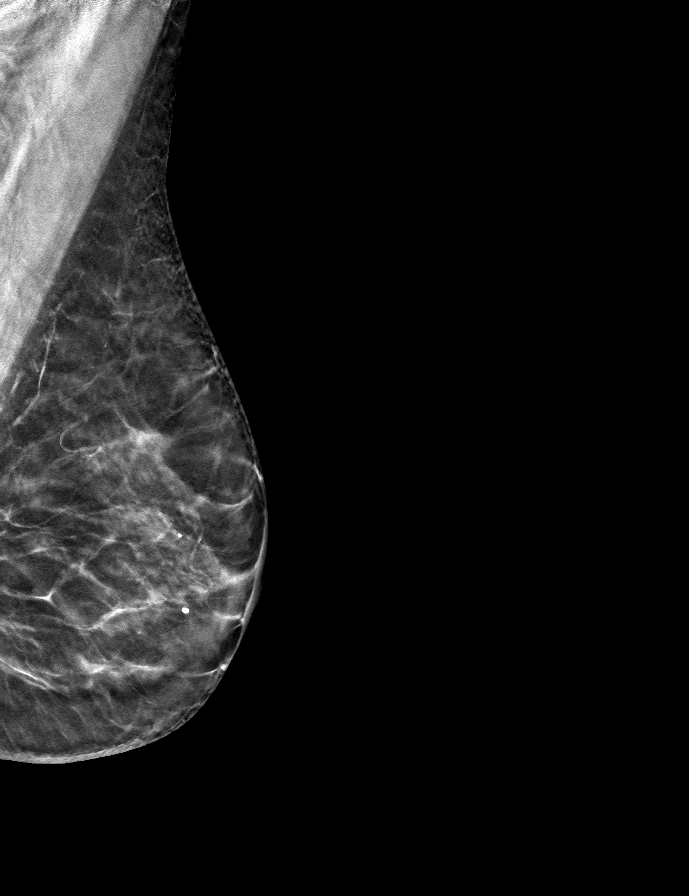

[R CC tomo · tomo slice 23/44.0]
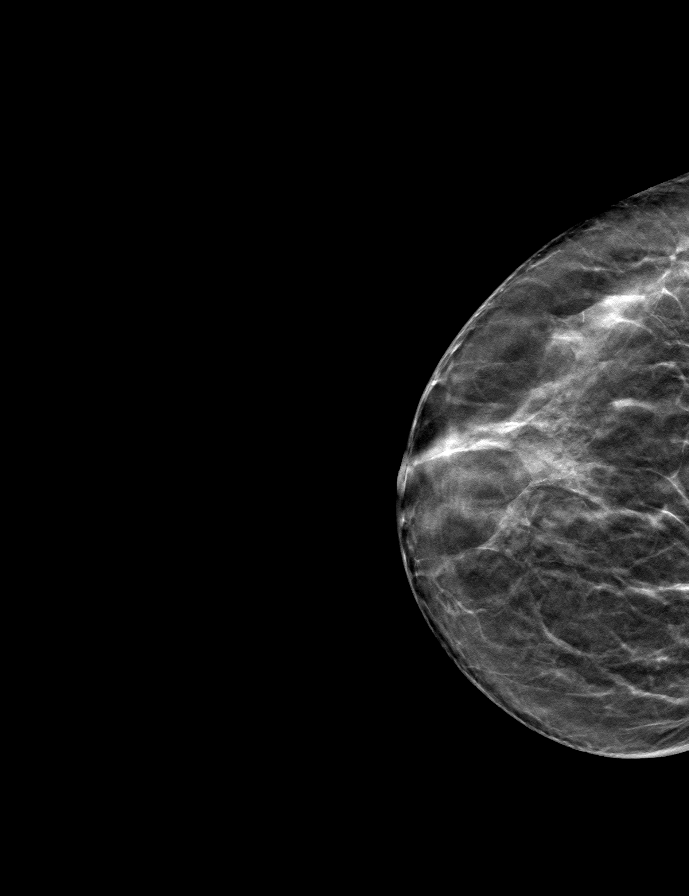

[R MLO tomo · tomo slice 23/45.0]
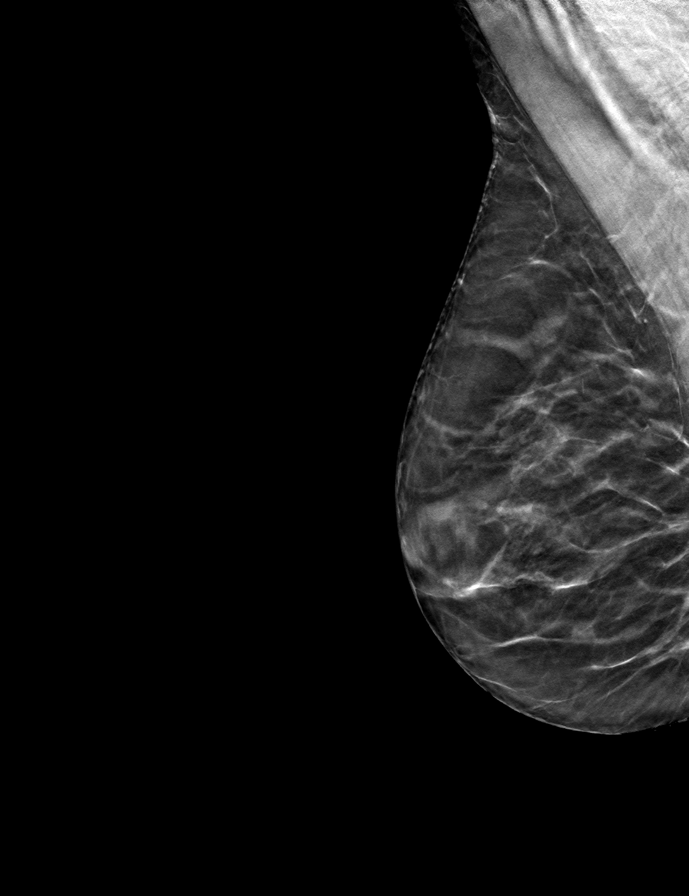

[L CC tomo · tomo slice 21/42.0]
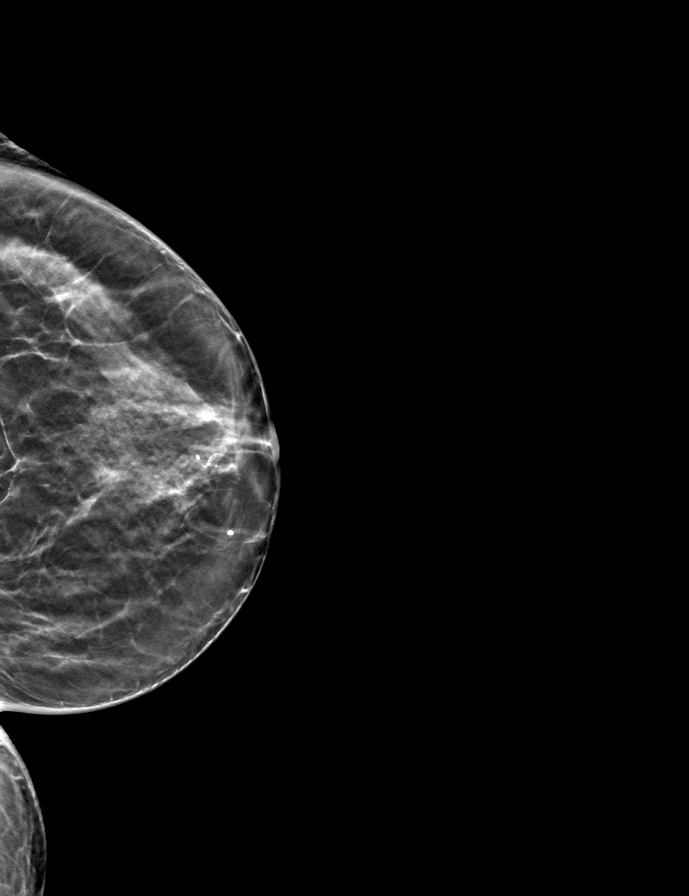

[9 of 24 positions shown; findings below may reference images not displayed]

ACR Breast Density Category b: There are scattered areas of
fibroglandular density.
FINDINGS: There are no findings suspicious for malignancy. Images were
processed with CAD.
IMPRESSION: No mammographic evidence of malignancy. A result letter of this
screening mammogram will be mailed directly to the patient.

RECOMMENDATION:
Screening mammogram in one year. (Code:Y5-G-EJ6)

BI-RADS CATEGORY  1: Negative.

## 2021-05-25 ENCOUNTER — Other Ambulatory Visit: Payer: Self-pay

## 2021-05-25 ENCOUNTER — Ambulatory Visit (INDEPENDENT_AMBULATORY_CARE_PROVIDER_SITE_OTHER): Payer: Medicare Other | Admitting: Family Medicine

## 2021-05-25 ENCOUNTER — Encounter: Payer: Self-pay | Admitting: Family Medicine

## 2021-05-25 VITALS — BP 114/66 | HR 86 | Temp 97.9°F | Resp 17 | Ht 61.0 in | Wt 103.8 lb

## 2021-05-25 DIAGNOSIS — R300 Dysuria: Secondary | ICD-10-CM | POA: Diagnosis not present

## 2021-05-25 DIAGNOSIS — N342 Other urethritis: Secondary | ICD-10-CM | POA: Diagnosis not present

## 2021-05-25 DIAGNOSIS — R319 Hematuria, unspecified: Secondary | ICD-10-CM

## 2021-05-25 LAB — POCT URINALYSIS DIP (MANUAL ENTRY)
Bilirubin, UA: NEGATIVE
Glucose, UA: NEGATIVE mg/dL
Leukocytes, UA: NEGATIVE
Nitrite, UA: NEGATIVE
Protein Ur, POC: 100 mg/dL — AB
Spec Grav, UA: 1.02 (ref 1.010–1.025)
Urobilinogen, UA: 0.2 E.U./dL
pH, UA: 6 (ref 5.0–8.0)

## 2021-05-25 MED ORDER — SULFAMETHOXAZOLE-TRIMETHOPRIM 800-160 MG PO TABS: 1.0000 | ORAL_TABLET | Freq: Two times a day (BID) | ORAL | 0 refills | Status: DC

## 2021-05-25 NOTE — Patient Instructions (Signed)
You may have a urethral infection as cause of urinary tract infection. Start antibiotic, drink plenty of fluids and I expect bleeding and symptoms to resolve in next week. If not improving in that time or worsening sooner, please return for recheck and further exam. Thanks for coming in today.   Return to the clinic or go to the nearest emergency room if any of your symptoms worsen or new symptoms occur.  Hematuria, Adult Hematuria is blood in the urine. Blood may be visible in the urine, or it may be identified with a test. This condition can be caused by infections of the bladder, urethra, kidney, or prostate. Other possible causes include: Kidney stones. Cancer of the urinary tract. Too much calcium in the urine. Conditions that are passed from parent to child (inherited conditions). Exercise that requires a lot of energy. Infections can usually be treated with medicine, and a kidney stone usually will pass through your urine. If neither of these is the cause of yourhematuria, more tests may be needed to identify the cause of your symptoms. It is very important to tell your health care provider about any blood in your urine, even if it is painless or the blood stops without treatment. Blood in the urine, when it happens and then stops and then happens again, can be a symptom of a very serious condition, including cancer. There is no pain in theinitial stages of many urinary cancers. Follow these instructions at home: Medicines Take over-the-counter and prescription medicines only as told by your health care provider. If you were prescribed an antibiotic medicine, take it as told by your health care provider. Do not stop taking the antibiotic even if you start to feel better. Eating and drinking Drink enough fluid to keep your urine pale yellow. It is recommended that you drink 3-4 quarts (2.8-3.8 L) a day. If you have been diagnosed with an infection, drinking cranberry juice in addition to  large amounts of water is recommended. Avoid caffeine, tea, and carbonated beverages. These tend to irritate the bladder. Avoid alcohol because it may irritate the prostate (in males). General instructions If you have been diagnosed with a kidney stone, follow your health care provider's instructions about straining your urine to catch the stone. Empty your bladder often. Avoid holding urine for long periods of time. If you are female: After a bowel movement, wipe from front to back and use each piece of toilet paper only once. Empty your bladder before and after sex. Pay attention to any changes in your symptoms. Tell your health care provider about any changes or any new symptoms. It is up to you to get the results of any tests. Ask your health care provider, or the department that is doing the test, when your results will be ready. Keep all follow-up visits. This is important. Contact a health care provider if: You develop back pain. You have a fever or chills. You have nausea or vomiting. Your symptoms do not improve after 3 days. Your symptoms get worse. Get help right away if: You develop severe vomiting and are unable to take medicine without vomiting. You develop severe pain in your back or abdomen even though you are taking medicine. You pass a large amount of blood in your urine. You pass blood clots in your urine. You feel very weak or like you might faint. You faint. Summary Hematuria is blood in the urine. It has many possible causes. It is very important that you tell your health care  provider about any blood in your urine, even if it is painless or the blood stops without treatment. Take over-the-counter and prescription medicines only as told by your health care provider. Drink enough fluid to keep your urine pale yellow. This information is not intended to replace advice given to you by your health care provider. Make sure you discuss any questions you have with your  healthcare provider. Document Revised: 06/09/2020 Document Reviewed: 06/09/2020 Elsevier Patient Education  2022 [place].   Urinary Tract Infection, Adult  A urinary tract infection (UTI) is an infection of any part of the urinary tract. The urinary tract includes the kidneys, ureters, bladder, and urethra.These organs make, store, and get rid of urine in the body. An upper UTI affects the ureters and kidneys. A lower UTI affects the bladderand urethra. What are the causes? Most urinary tract infections are caused by bacteria in your genital area around your urethra, where urine leaves your body. These bacteria grow andcause inflammation of your urinary tract. What increases the risk? You are more likely to develop this condition if: You have a urinary catheter that stays in place. You are not able to control when you urinate or have a bowel movement (incontinence). You are female and you: Use a spermicide or diaphragm for birth control. Have low estrogen levels. Are pregnant. You have certain genes that increase your risk. You are sexually active. You take antibiotic medicines. You have a condition that causes your flow of urine to slow down, such as: An enlarged prostate, if you are female. Blockage in your urethra. A kidney stone. A nerve condition that affects your bladder control (neurogenic bladder). Not getting enough to drink, or not urinating often. You have certain medical conditions, such as: Diabetes. A weak disease-fighting system (immunesystem). Sickle cell disease. Gout. Spinal cord injury. What are the signs or symptoms? Symptoms of this condition include: Needing to urinate right away (urgency). Frequent urination. This may include small amounts of urine each time you urinate. Pain or burning with urination. Blood in the urine. Urine that smells bad or unusual. Trouble urinating. Cloudy urine. Vaginal discharge, if you are female. Pain in the abdomen  or the lower back. You may also have: Vomiting or a decreased appetite. Confusion. Irritability or tiredness. A fever or chills. Diarrhea. The first symptom in older adults may be confusion. In some cases, they may nothave any symptoms until the infection has worsened. How is this diagnosed? This condition is diagnosed based on your medical history and a physical exam. You may also have other tests, including: Urine tests. Blood tests. Tests for STIs (sexually transmitted infections). If you have had more than one UTI, a cystoscopy or imaging studies may be doneto determine the cause of the infections. How is this treated? Treatment for this condition includes: Antibiotic medicine. Over-the-counter medicines to treat discomfort. Drinking enough water to stay hydrated. If you have frequent infections or have other conditions such as a kidney stone, you may need to see a health care provider who specializes in the urinary tract (urologist). In rare cases, urinary tract infections can cause sepsis. Sepsis is a life-threatening condition that occurs when the body responds to an infection. Sepsis is treated in the hospital with IV antibiotics, fluids, and othermedicines. Follow these instructions at home:  Medicines Take over-the-counter and prescription medicines only as told by your health care provider. If you were prescribed an antibiotic medicine, take it as told by your health care provider. Do not stop using  the antibiotic even if you start to feel better. General instructions Make sure you: Empty your bladder often and completely. Do not hold urine for long periods of time. Empty your bladder after sex. Wipe from front to back after urinating or having a bowel movement if you are female. Use each tissue only one time when you wipe. Drink enough fluid to keep your urine pale yellow. Keep all follow-up visits. This is important. Contact a health care provider if: Your symptoms do  not get better after 1-2 days. Your symptoms go away and then return. Get help right away if: You have severe pain in your back or your lower abdomen. You have a fever or chills. You have nausea or vomiting. Summary A urinary tract infection (UTI) is an infection of any part of the urinary tract, which includes the kidneys, ureters, bladder, and urethra. Most urinary tract infections are caused by bacteria in your genital area. Treatment for this condition often includes antibiotic medicines. If you were prescribed an antibiotic medicine, take it as told by your health care provider. Do not stop using the antibiotic even if you start to feel better. Keep all follow-up visits. This is important. This information is not intended to replace advice given to you by your health care provider. Make sure you discuss any questions you have with your healthcare provider. Document Revised: 05/21/2020 Document Reviewed: 05/21/2020 Elsevier Patient Education  [employer].

## 2021-05-25 NOTE — Progress Notes (Signed)
Subjective:  Patient ID: Tracy Zamora, female    DOB: Jul 17, 1941  Age: 80 y.o. MRN: [id number]  CC:  Chief Complaint  Patient presents with   Dysuria    Pt reports burning with urination since Sunday as well as some possible blood in her urine pt unsure of any other sxs at this time     HPI [name] presents for   Dysuria: Started with burning with urination 3 days ago, some blood with wiping at times. Notes blood primarily a time of urinating. Some urinary frequency and urgency.  Minimal on incontinence pad but no known vaginal bleeding.  Burning better 2 days ago, then again yesterday.  No fever, n/v, new back or abdominal pain.  Last UTI  - long time ago - 2 in her life?  Seems to be more irritation at urethral opening. Not sexually active.  No vaginal discharge/itching or apparent vaginal bleeding.   Tx: increased water intake, cranberry juice today.   No history of tobacco use.    History Patient Active Problem List   Diagnosis Date Noted   Osteoporosis 03/29/2020   Senile purpura ([place]) 03/12/2020   Hx of colonic polyps 04/07/2018   Alopecia 03/28/2018   Hyperlipidemia 03/28/2018   Hearing loss 03/28/2018   Hip pain 03/28/2018   Past Medical History:  Diagnosis Date   Alopecia    Back pain    lumbar. slipped disc years ago   Hx of colonic polyps 04/07/2018   Hyperlipidemia    Past Surgical History:  Procedure Laterality Date   uterine polypectomy     WISDOM TOOTH EXTRACTION     Allergies  Allergen Reactions   Penicillins     Causes UTI and ear infections    Prior to Admission medications   Medication Sig Start Date End Date Taking? Authorizing Provider  Biotin 1000 MCG tablet Take 1,000 mcg by mouth daily.    Yes [provider]  Calcium Carb-Cholecalciferol 600-800 MG-UNIT TABS Take 1 tablet by mouth 2 (two) times daily.    Yes [provider]  Multiple Vitamin (MULTI-VITAMIN DAILY PO) Take by mouth.   Yes  [provider]  alendronate (FOSAMAX) 70 MG tablet Take 1 tablet (70 mg total) by mouth once a week. Take with a full glass of water on an empty stomach. Patient not taking: Reported on 05/25/2021 03/29/20   Marin Olp, MD   Social History   Socioeconomic History   Marital status: Married    Spouse name: Not on file   Number of children: 3   Years of education: Not on file   Highest education level: Not on file  Occupational History   Not on file  Tobacco Use   Smoking status: Never   Smokeless tobacco: Never  Substance and Sexual Activity   Alcohol use: No   Drug use: No   Sexual activity: Not on file  Other Topics Concern   Not on file  Social History Narrative   married, she has 3 children-2 sons one daughter.  6 grandkids.    She has a second home in [place].      Homemaking.       Hobbies: big exerciser   Social Determinants of Health   Financial Resource Strain: Not on file  Food Insecurity: Not on file  Transportation Needs: Not on file  Physical Activity: Not on file  Stress: Not on file  Social Connections: Not on file  Intimate Partner  Violence: Not on file    Review of Systems Per HPI.   Objective:   Vitals:   05/25/21 1133  BP: 114/66  Pulse: 86  Resp: 17  Temp: 97.9 F (36.6 C)  TempSrc: Temporal  SpO2: 98%  Weight: 103 lb 12.8 oz (47.1 kg)  Height: '5\' 1"'$  (1.549 m)     Physical Exam Vitals reviewed.  Constitutional:      Appearance: Normal appearance. She is well-developed. She is not ill-appearing or toxic-appearing.  HENT:     Head: Normocephalic and atraumatic.  Eyes:     Conjunctiva/sclera: Conjunctivae normal.     Pupils: Pupils are equal, round, and reactive to light.  Neck:     Vascular: No carotid bruit.  Cardiovascular:     Rate and Rhythm: Normal rate and regular rhythm.     Heart sounds: Normal heart sounds.  Pulmonary:     Effort: Pulmonary effort is normal.     Breath sounds: Normal  breath sounds.  Abdominal:     Palpations: Abdomen is soft. There is no pulsatile mass.     Tenderness: There is no abdominal tenderness. There is no right CVA tenderness, left CVA tenderness or guarding.  Musculoskeletal:     Right lower leg: No edema.     Left lower leg: No edema.  Skin:    General: Skin is warm and dry.  Neurological:     Mental Status: She is alert and oriented to person, place, and time.  Psychiatric:        Mood and Affect: Mood normal.        Behavior: Behavior normal.     Results for orders placed or performed in visit on 05/25/21  POCT urinalysis dipstick  Result Value Ref Range   Color, UA yellow yellow   Clarity, UA cloudy (A) clear   Glucose, UA negative negative mg/dL   Bilirubin, UA negative negative   Ketones, POC UA small (15) (A) negative mg/dL   Spec Grav, UA 1.020 1.010 - 1.025   Blood, UA moderate (A) negative   pH, UA 6.0 5.0 - 8.0   Protein Ur, POC =100 (A) negative mg/dL   Urobilinogen, UA 0.2 0.2 or 1.0 E.U./dL   Nitrite, UA Negative Negative   Leukocytes, UA Negative Negative     Assessment & Plan:  Tracy Zamora is a 80 y.o. female . Dysuria - Plan: POCT urinalysis dipstick, Urine Culture, sulfamethoxazole-trimethoprim (BACTRIM DS) 800-160 MG tablet  Hematuria, unspecified type - Plan: Urine Culture, sulfamethoxazole-trimethoprim (BACTRIM DS) 800-160 MG tablet  Urethritis - Plan: sulfamethoxazole-trimethoprim (BACTRIM DS) 800-160 MG tablet  Suspected infectious urethritis UTI as cause of hematuria. No concerns on exam. Initial trial of septra DS for 5 days, continue fluid intake, rtc precautions if persistent hematuria for GU exam and further testing.   Meds ordered this encounter  Medications   sulfamethoxazole-trimethoprim (BACTRIM DS) 800-160 MG tablet    Sig: Take 1 tablet by mouth 2 (two) times daily.    Dispense:  10 tablet    Refill:  0   Patient Instructions  You may have a urethral infection as cause of  urinary tract infection. Start antibiotic, drink plenty of fluids and I expect bleeding and symptoms to resolve in next week. If not improving in that time or worsening sooner, please return for recheck and further exam. Thanks for coming in today.   Return to the clinic or go to the nearest emergency room if any of your symptoms worsen or  new symptoms occur.  Hematuria, Adult Hematuria is blood in the urine. Blood may be visible in the urine, or it may be identified with a test. This condition can be caused by infections of the bladder, urethra, kidney, or prostate. Other possible causes include: Kidney stones. Cancer of the urinary tract. Too much calcium in the urine. Conditions that are passed from parent to child (inherited conditions). Exercise that requires a lot of energy. Infections can usually be treated with medicine, and a kidney stone usually will pass through your urine. If neither of these is the cause of yourhematuria, more tests may be needed to identify the cause of your symptoms. It is very important to tell your health care provider about any blood in your urine, even if it is painless or the blood stops without treatment. Blood in the urine, when it happens and then stops and then happens again, can be a symptom of a very serious condition, including cancer. There is no pain in theinitial stages of many urinary cancers. Follow these instructions at home: Medicines Take over-the-counter and prescription medicines only as told by your health care provider. If you were prescribed an antibiotic medicine, take it as told by your health care provider. Do not stop taking the antibiotic even if you start to feel better. Eating and drinking Drink enough fluid to keep your urine pale yellow. It is recommended that you drink 3-4 quarts (2.8-3.8 L) a day. If you have been diagnosed with an infection, drinking cranberry juice in addition to large amounts of water is recommended. Avoid  caffeine, tea, and carbonated beverages. These tend to irritate the bladder. Avoid alcohol because it may irritate the prostate (in males). General instructions If you have been diagnosed with a kidney stone, follow your health care provider's instructions about straining your urine to catch the stone. Empty your bladder often. Avoid holding urine for long periods of time. If you are female: After a bowel movement, wipe from front to back and use each piece of toilet paper only once. Empty your bladder before and after sex. Pay attention to any changes in your symptoms. Tell your health care provider about any changes or any new symptoms. It is up to you to get the results of any tests. Ask your health care provider, or the department that is doing the test, when your results will be ready. Keep all follow-up visits. This is important. Contact a health care provider if: You develop back pain. You have a fever or chills. You have nausea or vomiting. Your symptoms do not improve after 3 days. Your symptoms get worse. Get help right away if: You develop severe vomiting and are unable to take medicine without vomiting. You develop severe pain in your back or abdomen even though you are taking medicine. You pass a large amount of blood in your urine. You pass blood clots in your urine. You feel very weak or like you might faint. You faint. Summary Hematuria is blood in the urine. It has many possible causes. It is very important that you tell your health care provider about any blood in your urine, even if it is painless or the blood stops without treatment. Take over-the-counter and prescription medicines only as told by your health care provider. Drink enough fluid to keep your urine pale yellow. This information is not intended to replace advice given to you by your health care provider. Make sure you discuss any questions you have with your healthcare provider. Document Revised:  06/09/2020 Document Reviewed: 06/09/2020 Elsevier Patient Education  2022 [place].   Urinary Tract Infection, Adult  A urinary tract infection (UTI) is an infection of any part of the urinary tract. The urinary tract includes the kidneys, ureters, bladder, and urethra.These organs make, store, and get rid of urine in the body. An upper UTI affects the ureters and kidneys. A lower UTI affects the bladderand urethra. What are the causes? Most urinary tract infections are caused by bacteria in your genital area around your urethra, where urine leaves your body. These bacteria grow andcause inflammation of your urinary tract. What increases the risk? You are more likely to develop this condition if: You have a urinary catheter that stays in place. You are not able to control when you urinate or have a bowel movement (incontinence). You are female and you: Use a spermicide or diaphragm for birth control. Have low estrogen levels. Are pregnant. You have certain genes that increase your risk. You are sexually active. You take antibiotic medicines. You have a condition that causes your flow of urine to slow down, such as: An enlarged prostate, if you are female. Blockage in your urethra. A kidney stone. A nerve condition that affects your bladder control (neurogenic bladder). Not getting enough to drink, or not urinating often. You have certain medical conditions, such as: Diabetes. A weak disease-fighting system (immunesystem). Sickle cell disease. Gout. Spinal cord injury. What are the signs or symptoms? Symptoms of this condition include: Needing to urinate right away (urgency). Frequent urination. This may include small amounts of urine each time you urinate. Pain or burning with urination. Blood in the urine. Urine that smells bad or unusual. Trouble urinating. Cloudy urine. Vaginal discharge, if you are female. Pain in the abdomen or the lower back. You may also  have: Vomiting or a decreased appetite. Confusion. Irritability or tiredness. A fever or chills. Diarrhea. The first symptom in older adults may be confusion. In some cases, they may nothave any symptoms until the infection has worsened. How is this diagnosed? This condition is diagnosed based on your medical history and a physical exam. You may also have other tests, including: Urine tests. Blood tests. Tests for STIs (sexually transmitted infections). If you have had more than one UTI, a cystoscopy or imaging studies may be doneto determine the cause of the infections. How is this treated? Treatment for this condition includes: Antibiotic medicine. Over-the-counter medicines to treat discomfort. Drinking enough water to stay hydrated. If you have frequent infections or have other conditions such as a kidney stone, you may need to see a health care provider who specializes in the urinary tract (urologist). In rare cases, urinary tract infections can cause sepsis. Sepsis is a life-threatening condition that occurs when the body responds to an infection. Sepsis is treated in the hospital with IV antibiotics, fluids, and othermedicines. Follow these instructions at home:  Medicines Take over-the-counter and prescription medicines only as told by your health care provider. If you were prescribed an antibiotic medicine, take it as told by your health care provider. Do not stop using the antibiotic even if you start to feel better. General instructions Make sure you: Empty your bladder often and completely. Do not hold urine for long periods of time. Empty your bladder after sex. Wipe from front to back after urinating or having a bowel movement if you are female. Use each tissue only one time when you wipe. Drink enough fluid to keep your urine pale yellow. Keep all follow-up  visits. This is important. Contact a health care provider if: Your symptoms do not get better after 1-2  days. Your symptoms go away and then return. Get help right away if: You have severe pain in your back or your lower abdomen. You have a fever or chills. You have nausea or vomiting. Summary A urinary tract infection (UTI) is an infection of any part of the urinary tract, which includes the kidneys, ureters, bladder, and urethra. Most urinary tract infections are caused by bacteria in your genital area. Treatment for this condition often includes antibiotic medicines. If you were prescribed an antibiotic medicine, take it as told by your health care provider. Do not stop using the antibiotic even if you start to feel better. Keep all follow-up visits. This is important. This information is not intended to replace advice given to you by your health care provider. Make sure you discuss any questions you have with your healthcare provider. Document Revised: 05/21/2020 Document Reviewed: 05/21/2020 Elsevier Patient Education  2022 [place],   Merri Ray, MD [place], [facility] 05/25/21 12:39 PM

## 2021-05-27 LAB — URINE CULTURE
MICRO NUMBER:: 12199314
SPECIMEN QUALITY:: ADEQUATE

## 2021-07-25 DIAGNOSIS — H2513 Age-related nuclear cataract, bilateral: Secondary | ICD-10-CM | POA: Diagnosis not present

## 2021-07-25 DIAGNOSIS — H5213 Myopia, bilateral: Secondary | ICD-10-CM | POA: Diagnosis not present

## 2021-07-25 DIAGNOSIS — H524 Presbyopia: Secondary | ICD-10-CM | POA: Diagnosis not present

## 2021-07-25 DIAGNOSIS — H52223 Regular astigmatism, bilateral: Secondary | ICD-10-CM | POA: Diagnosis not present

## 2021-10-03 NOTE — Progress Notes (Signed)
Phone [phone]   Subjective:  Patient presents today for their annual physical. Chief complaint-noted.   See problem oriented charting- ROS- full  review of systems was completed and negative except for: hearing loss at baseline, hoarseness (declines ENT referral), allergies in AM in particular- am congestion, sinus pressure, runny nose, light sensitivity- mainly sun , hot and cold intolerance, urinary frequency stable with baseline, joint pain, back pain, neck stiffness  The following were reviewed and entered/updated in epic: Past Medical History:  Diagnosis Date   Alopecia    Back pain    lumbar. slipped disc years ago   Hx of colonic polyps 04/07/2018   Hyperlipidemia    Patient Active Problem List   Diagnosis Date Noted   Osteoporosis 03/29/2020    Priority: Medium    Senile purpura ([place]) 03/12/2020    Priority: Medium    Alopecia 03/28/2018    Priority: Medium    Hyperlipidemia 03/28/2018    Priority: Medium    Hx of colonic polyps 04/07/2018    Priority: Low   Hearing loss 03/28/2018    Priority: Low   Hip pain 03/28/2018    Priority: Low   Past Surgical History:  Procedure Laterality Date   uterine polypectomy     WISDOM TOOTH EXTRACTION      Family History  Problem Relation Age of Onset   Prostate cancer Father        32   Stroke Father        mild stroke late 44s   Alzheimer's disease Mother        42   Healthy Brother    Colon cancer Neg Hx    Esophageal cancer Neg Hx    Rectal cancer Neg Hx    Stomach cancer Neg Hx     Medications- reviewed and updated Current Outpatient Medications  Medication Sig Dispense Refill   Biotin 1000 MCG tablet Take 1,000 mcg by mouth daily.      Calcium Carb-Cholecalciferol 600-800 MG-UNIT TABS Take 1 tablet by mouth 2 (two) times daily.      Multiple Vitamin (MULTI-VITAMIN DAILY PO) Take by mouth.     naproxen sodium (ALEVE) 220 MG tablet Take 220 mg by mouth.     No current facility-administered  medications for this visit.    Allergies-reviewed and updated Allergies  Allergen Reactions   Penicillins     Causes UTI and ear infections     Social History   Social History Narrative   married, she has 3 children-2 sons one daughter.  6 grandkids.    She has a second home in [place].      Homemaking.       Hobbies: big exerciser   Objective  Objective:  BP 128/80 (BP Location: Right Arm, Patient Position: Sitting)   Pulse 74   Temp 97.6 F (36.4 C) (Tympanic)   Ht 5' (1.524 m)   Wt 102 lb 12.8 oz (46.6 kg)   SpO2 96%   BMI 20.08 kg/m  Gen: NAD, resting comfortably HEENT: Mucous membranes are moist. Oropharynx normal Neck: no thyromegaly CV: RRR no murmurs rubs or gallops Lungs: CTAB no crackles, wheeze, rhonchi Abdomen: soft/nontender/nondistended/normal bowel sounds. No rebound or guarding.  Ext: no edema Skin: warm, dry Neuro: grossly normal, moves all extremities, PERRLA   Assessment and Plan   80 y.o. female presenting for annual physical.  Health Maintenance counseling: 1. Anticipatory guidance: Patient counseled regarding regular dental exams -q6 months, eye exams - yearly-  just had,  avoiding smoking and second hand smoke , limiting alcohol to 1 beverage per day- doesn't drink at all , no illicit drugs.   2. Risk factor reduction:  Advised patient of need for regular exercise and diet rich and fruits and vegetables to reduce risk of heart attack and stroke.  Exercise-  walks over an hour 5-6 days a week, weighted exercises 5-6 days a week over an hour.  Diet/weight management-advised against weight loss- would prefer gaining- up 1 lb! .  Wt Readings from Last 3 Encounters:  10/10/21 102 lb 12.8 oz (46.6 kg)  05/25/21 103 lb 12.8 oz (47.1 kg)  03/12/20 101 lb (45.8 kg)  3. Immunizations/screenings/ancillary studies-discussed Shingrix at pharmacy, recommended Prevnar 20- declines for now  Immunization History  Administered Date(s)  Administered   PFIZER(Purple Top)SARS-COV-2 Vaccination 12/22/2019, 01/20/2020   Pneumococcal Polysaccharide-23 03/12/2020   4. Cervical cancer screening- no recent history of abnormal pap smear- early 2000s was last time- no further workup planned now 5. Breast cancer screening-  plans to no longer do more mammograms- wants to do self exams alone 6. Colon cancer screening - 04/02/2018 and no repeat planned due to age and reassuring colonoscopy.  7. Skin cancer screening- minor derm checks in past- no significant issues. advised regular sunscreen use. Denies worrisome, changing, or new skin lesions.  8. Birth control/STD check- postmenopausal. Only active with husband 9. Osteoporosis screening at 64- see below 10. Smoking associated screening - Never smoker  Status of chronic or acute concerns   # Thyroid concern S:ongoing hairloss- loss of hair on legs, arms, thinned eyebrows, nosehairs. Cold sensitivity but at other times feels too hot- still gets hot flashes from menopause but has calmed down   -hoarse voice since last visit  (she thinks maybe going on for 2 years but didn't mention previously) and some dark bags under eyes another concern A/P: she would like to check tsh again and also we did not do TPO previously- she specifically requests this due to symptoms- I have ordered these for her.   Hoarseness for 2 years- refer to ENT recommended- she declines and wants to do thyroid testing first   # Osteoporosis S: Last DEXA: 2021 with worst t score at -2.5   Medication (bisphosphonate or prolia): none - she never started fosamax- worried about reflux (has some now) and osteonecrosis risks  Calcium: 1200mg  (through diet ok) recommended - she is on this Vitamin D: 1000 units a day recommended- she is on this. Sounds like at least 2k if not more (may actually have to cut down if levels high) Doing weight bearing exercises and no balance issues Last vitamin D Lab Results  Component Value  Date   VD25OH 84 01/31/2011  A/P: Osteoporosis noted from 2021-currently not on treatment of the calcium, vitamin D, weightbearing exercise.  We had discussed taking Fosamax but she is concerned about the side effects which we discussed again today.  She would like to continue to work on healthy eating, regular exercise, calcium and vitamin D intake and recheck bone density in 2023 -had some bone aches after bone density and not sure she wants to do that 100% either  #hyperlipidemia S: Medication:none  Lab Results  Component Value Date   CHOL 222 (H) 03/12/2020   HDL 87 03/12/2020   LDLCALC 120 (H) 03/12/2020   LDLDIRECT 161.3 01/31/2011   TRIG 54 03/12/2020   CHOLHDL 2.6 03/12/2020   A/P: mild poor control- update lipids and recalculate ascvd  risk- prior levels wereat 23%. Her preference is to avoid medicine even if just once a week. Also mentioned ct cardiac scoring- she declines  #urinary frequency still- she states age reltaed and declines UA  #senile purpura- notes easy bruising still on extremities- monitor CBC - does take aleve daily and advised against- will check kidney function  Recommended follow up: No follow-ups on file.  Lab/Order associations: FASTING for 8 hours. Biotin in MV otherwise no outside biotin in last 2 days   ICD-10-CM   1. Hyperlipidemia, unspecified hyperlipidemia type  E78.5 TSH    CBC with Differential/Platelet    Comprehensive metabolic panel    Lipid panel    2. Age-related osteoporosis without current pathological fracture  M81.0 TSH    Vitamin D (25 hydroxy)    3. Alopecia  L65.9 Thyroid peroxidase antibody    T3, free    T4, free    4. Senile purpura ([facility]) Chronic D69.2      No orders of the defined types were placed in this encounter.  Time Spent: 30 minutes of total time (1:03 PM- 1: 33 PM) was spent on the date of the encounter performing the following actions: chart review prior to seeing the patient, obtaining history, performing a  medically necessary exam, counseling on the treatment plan, placing orders, and documenting in our EHR.   I,Jada Bradford,acting as a scribe for Garret Reddish, MD.,have documented all relevant documentation on the behalf of Garret Reddish, MD,as directed by  Garret Reddish, MD while in the presence of Garret Reddish, MD.   I, Garret Reddish, MD, have reviewed all documentation for this visit. The documentation on 10/10/21 for the exam, diagnosis, procedures, and orders are all accurate and complete.   Return precautions advised.  Garret Reddish, MD

## 2021-10-03 NOTE — Patient Instructions (Addendum)
Health Maintenance Due  Topic Date Due   Zoster Vaccines- Shingrix (1 of 2) -  -Please consider getting your shingles shot at your local pharmacy. If received, let us know.   Never done   Pneumonia Vaccine 39+ Years old (2 - PCV) -   - Please consider getting your prevnar-20 shot at your local pharmacy. If received, let us know.   03/12/2021   Let us know if you want to do the Ear,Nose and Throat referral.   I love that you're exercising and maintaining your weight! Please keep up the great work!   Please stop by lab before you go If you have mychart- we will send your results within 3 business days of [place] receiving them.  If you do not have mychart- we will call you about results within 5 business days of [place] receiving them.  *please also note that you will see labs on mychart as soon as they post. I will later go in and write notes on them- will say "notes from Dr. Yong Channel"  Recommended follow up: Return in about 1 year (around 10/10/2022) for follow up- or sooner if needed.  Happy Holidays!

## 2021-10-10 ENCOUNTER — Ambulatory Visit (INDEPENDENT_AMBULATORY_CARE_PROVIDER_SITE_OTHER): Payer: Medicare Other | Admitting: Family Medicine

## 2021-10-10 ENCOUNTER — Other Ambulatory Visit: Payer: Self-pay

## 2021-10-10 ENCOUNTER — Encounter: Payer: Self-pay | Admitting: Family Medicine

## 2021-10-10 VITALS — BP 128/80 | HR 74 | Temp 97.6°F | Ht 60.0 in | Wt 102.8 lb

## 2021-10-10 DIAGNOSIS — L659 Nonscarring hair loss, unspecified: Secondary | ICD-10-CM | POA: Diagnosis not present

## 2021-10-10 DIAGNOSIS — D692 Other nonthrombocytopenic purpura: Secondary | ICD-10-CM

## 2021-10-10 DIAGNOSIS — M810 Age-related osteoporosis without current pathological fracture: Secondary | ICD-10-CM | POA: Diagnosis not present

## 2021-10-10 DIAGNOSIS — E785 Hyperlipidemia, unspecified: Secondary | ICD-10-CM | POA: Diagnosis not present

## 2021-10-11 LAB — CBC WITH DIFFERENTIAL/PLATELET
Basophils Absolute: 0 10*3/uL (ref 0.0–0.1)
Basophils Relative: 0.4 % (ref 0.0–3.0)
Eosinophils Absolute: 0.1 10*3/uL (ref 0.0–0.7)
Eosinophils Relative: 1.1 % (ref 0.0–5.0)
HCT: 40.5 % (ref 36.0–46.0)
Hemoglobin: 13.5 g/dL (ref 12.0–15.0)
Lymphocytes Relative: 20.6 % (ref 12.0–46.0)
Lymphs Abs: 1.7 10*3/uL (ref 0.7–4.0)
MCHC: 33.4 g/dL (ref 30.0–36.0)
MCV: 92.8 fl (ref 78.0–100.0)
Monocytes Absolute: 0.7 10*3/uL (ref 0.1–1.0)
Monocytes Relative: 8.2 % (ref 3.0–12.0)
Neutro Abs: 5.9 10*3/uL (ref 1.4–7.7)
Neutrophils Relative %: 69.7 % (ref 43.0–77.0)
Platelets: 335 10*3/uL (ref 150.0–400.0)
RBC: 4.37 Mil/uL (ref 3.87–5.11)
RDW: 13.5 % (ref 11.5–15.5)
WBC: 8.4 10*3/uL (ref 4.0–10.5)

## 2021-10-11 LAB — LIPID PANEL
Cholesterol: 234 mg/dL — ABNORMAL HIGH (ref 0–200)
HDL: 80.5 mg/dL (ref >39.00)
LDL Cholesterol: 138 mg/dL — ABNORMAL HIGH (ref 0–99)
NonHDL: 153.6
Total CHOL/HDL Ratio: 3
Triglycerides: 77 mg/dL (ref 0.0–149.0)
VLDL: 15.4 mg/dL (ref 0.0–40.0)

## 2021-10-11 LAB — COMPREHENSIVE METABOLIC PANEL
ALT: 15 U/L (ref 0–35)
AST: 24 U/L (ref 0–37)
Albumin: 4.3 g/dL (ref 3.5–5.2)
Alkaline Phosphatase: 67 U/L (ref 39–117)
BUN: 14 mg/dL (ref 6–23)
CO2: 32 mEq/L (ref 19–32)
Calcium: 10.3 mg/dL (ref 8.4–10.5)
Chloride: 97 mEq/L (ref 96–112)
Creatinine, Ser: 0.66 mg/dL (ref 0.40–1.20)
GFR: 84.12 mL/min (ref >60.00)
Glucose, Bld: 92 mg/dL (ref 70–99)
Potassium: 4.3 mEq/L (ref 3.5–5.1)
Sodium: 137 mEq/L (ref 135–145)
Total Bilirubin: 0.7 mg/dL (ref 0.2–1.2)
Total Protein: 6.8 g/dL (ref 6.0–8.3)

## 2021-10-11 LAB — TSH: TSH: 2.81 u[IU]/mL (ref 0.35–5.50)

## 2021-10-11 LAB — T3, FREE: T3, Free: 2.8 pg/mL (ref 2.3–4.2)

## 2021-10-11 LAB — T4, FREE: Free T4: 1.03 ng/dL (ref 0.60–1.60)

## 2021-10-11 LAB — THYROID PEROXIDASE ANTIBODY: Thyroperoxidase Ab SerPl-aCnc: 1 IU/mL (ref <9)

## 2021-10-11 LAB — VITAMIN D 25 HYDROXY (VIT D DEFICIENCY, FRACTURES): VITD: 90.63 ng/mL (ref 30.00–100.00)

## 2021-12-13 ENCOUNTER — Telehealth: Payer: Self-pay | Admitting: Family Medicine

## 2021-12-13 NOTE — Telephone Encounter (Signed)
Copied from [place] [id number]. Topic: Medicare AWV >> Dec 13, 2021 10:30 AM Harris-Coley, Hannah Beat wrote: Reason for CRM: Left message for patient to schedule Annual Wellness Visit.  Please schedule with Nurse Health Advisor Charlott Rakes, RN at [facility].  Please call [phone] ask for [facility]

## 2021-12-13 NOTE — Telephone Encounter (Signed)
Copied from [place] [id number]. Topic: Medicare AWV >> Dec 13, 2021  9:42 AM Harris-Coley, Hannah Beat wrote: Reason for CRM: Left message for patient to schedule Annual Wellness Visit.  Please schedule with Nurse Health Advisor Charlott Rakes, RN at [facility].  Please call [phone] ask for [facility]

## 2022-05-18 ENCOUNTER — Telehealth: Payer: Self-pay | Admitting: Family Medicine

## 2022-05-18 NOTE — Telephone Encounter (Signed)
Copied from [place] [id number]. Topic: Medicare AWV >> May 18, 2022 10:23 AM Devoria Glassing wrote: Reason for CRM: Left message for patient to schedule Annual Wellness Visit.  Please schedule with Nurse Health Advisor Charlott Rakes, RN at [facility]. This appt can be telephone or office visit. Please call [phone] ask for [facility]

## 2022-05-30 NOTE — Progress Notes (Signed)
    Tracy Zamora D.[place] Phone: [phone]   Assessment and Plan:     1. Chronic bilateral low back pain without sciatica -Chronic with exacerbation, initial sports medicine visit - Chronic low back pain with worsening over the past several months consistent with flares of lumbar DDD based on HPI, physical exam, x-rays - X-ray obtained in clinic.  My interpretation: No acute fracture or dislocation.  Grade 1 anterior listhesis L4 and L5.  Facet arthropathy most prominent at L4-S1 - Start meloxicam 7.5 mg daily x2 weeks.  Do not take other NSAIDs while on meloxicam.  May use Tylenol for breakthrough pain.  After 2 weeks discontinue meloxicam and can restart taking daily Tylenol arthritis - Start HEP for low back - Start physical therapy for low back.  Referral sent - DG Lumbar Spine 2-3 Views; Future - DG HIPS BILAT WITH PELVIS MIN 5 VIEWS; Future    Pertinent previous records reviewed include none   Follow Up: 4 weeks for reevaluation.  Could consider OMT versus CSI versus advanced imaging based on patient's symptoms   Subjective:   I, Tracy Zamora, am serving as a Education administrator for Doctor Glennon Mac  Chief Complaint: low back and hip pain   HPI:   06/07/2022 Patient is a 81 year old female complaining of low back and hip pain. Patient states that 30 years of running and walking , no radiating pain , no numbness or tingling, sometimes has shin pain , one tylenol everyday for the pain seems to be helping, no locking or clicking when she does exercise she feels it on her left  hip , sometimes gets a dead  on he left, left is worst than the right    Relevant Historical Information: Osteoporosis  Additional pertinent review of systems negative.   Current Outpatient Medications:    Biotin 1000 MCG tablet, Take 1,000 mcg by mouth daily. , Disp: , Rfl:    Calcium Carb-Cholecalciferol 600-800 MG-UNIT  TABS, Take 1 tablet by mouth 2 (two) times daily. , Disp: , Rfl:    meloxicam (MOBIC) 15 MG tablet, Take 1 tablet (15 mg total) by mouth daily., Disp: 14 tablet, Rfl: 0   Multiple Vitamin (MULTI-VITAMIN DAILY PO), Take by mouth., Disp: , Rfl:    naproxen sodium (ALEVE) 220 MG tablet, Take 220 mg by mouth., Disp: , Rfl:    Objective:     Vitals:   06/07/22 1319  BP: 132/82  Pulse: (!) 58  SpO2: 100%  Weight: 102 lb (46.3 kg)  Height: 5' (1.524 m)      Body mass index is 19.92 kg/m.    Physical Exam:    Gen: Appears well, nad, nontoxic and pleasant Psych: Alert and oriented, appropriate mood and affect Neuro: sensation intact, strength is 5/5 in upper and lower extremities, muscle tone wnl Skin: no susupicious lesions or rashes  Back - Normal skin, Spine with normal alignment and no deformity.   No tenderness to vertebral process palpation.   Vital neural lumbar paraspinous muscles are mildly tender and without spasm Straight leg raise negative Trendelenberg negative  Negative piriformis test on the right though caused moderate tightness, negative on the left  Electronically signed by:  Tracy Zamora D.[facility] 1:48 PM 06/07/22

## 2022-06-07 ENCOUNTER — Ambulatory Visit (INDEPENDENT_AMBULATORY_CARE_PROVIDER_SITE_OTHER): Payer: Medicare Other | Admitting: Sports Medicine

## 2022-06-07 ENCOUNTER — Ambulatory Visit (INDEPENDENT_AMBULATORY_CARE_PROVIDER_SITE_OTHER): Payer: Medicare Other

## 2022-06-07 VITALS — BP 132/82 | HR 58 | Ht 60.0 in | Wt 102.0 lb

## 2022-06-07 DIAGNOSIS — M5136 Other intervertebral disc degeneration, lumbar region: Secondary | ICD-10-CM

## 2022-06-07 DIAGNOSIS — G8929 Other chronic pain: Secondary | ICD-10-CM | POA: Diagnosis not present

## 2022-06-07 DIAGNOSIS — M25551 Pain in right hip: Secondary | ICD-10-CM | POA: Diagnosis not present

## 2022-06-07 DIAGNOSIS — M25552 Pain in left hip: Secondary | ICD-10-CM | POA: Diagnosis not present

## 2022-06-07 DIAGNOSIS — M545 Low back pain, unspecified: Secondary | ICD-10-CM

## 2022-06-07 MED ORDER — MELOXICAM 15 MG PO TABS: 15.0000 mg | ORAL_TABLET | Freq: Every day | ORAL | 0 refills | Status: DC

## 2022-06-07 NOTE — Patient Instructions (Addendum)
Good to see you - Start meloxicam 15 mg daily x2 weeks. Do not to use additional NSAIDs while taking meloxicam.  May use Tylenol [phone] mg 2 to 3 times a day for breakthrough pain. Can restart tylenol arthritis after completing  2 week course of meloxicam  Start HEP low back  Pt referral 4 week follow up

## 2022-06-21 ENCOUNTER — Other Ambulatory Visit: Payer: Self-pay

## 2022-06-21 ENCOUNTER — Encounter: Payer: Self-pay | Admitting: Family Medicine

## 2022-06-21 ENCOUNTER — Ambulatory Visit (INDEPENDENT_AMBULATORY_CARE_PROVIDER_SITE_OTHER): Payer: Medicare Other | Admitting: Family Medicine

## 2022-06-21 VITALS — BP 118/60 | HR 81 | Temp 97.5°F | Ht 60.0 in | Wt 103.5 lb

## 2022-06-21 DIAGNOSIS — R300 Dysuria: Secondary | ICD-10-CM

## 2022-06-21 DIAGNOSIS — N3001 Acute cystitis with hematuria: Secondary | ICD-10-CM | POA: Diagnosis not present

## 2022-06-21 DIAGNOSIS — R319 Hematuria, unspecified: Secondary | ICD-10-CM

## 2022-06-21 DIAGNOSIS — R350 Frequency of micturition: Secondary | ICD-10-CM

## 2022-06-21 LAB — POCT URINALYSIS DIPSTICK
Bilirubin, UA: NEGATIVE
Blood, UA: POSITIVE
Glucose, UA: NEGATIVE
Nitrite, UA: NEGATIVE
Protein, UA: POSITIVE — AB
Spec Grav, UA: 1.02 (ref 1.010–1.025)
Urobilinogen, UA: 0.2 E.U./dL
pH, UA: 6 (ref 5.0–8.0)

## 2022-06-21 MED ORDER — SULFAMETHOXAZOLE-TRIMETHOPRIM 800-160 MG PO TABS: 1.0000 | ORAL_TABLET | Freq: Two times a day (BID) | ORAL | 0 refills | Status: DC

## 2022-06-21 NOTE — Patient Instructions (Signed)
It was very nice to see you today!  Drink plenty of water.  If worse, fevers, etc, call or ER   PLEASE NOTE:  If you had any lab tests please let us know if you have not heard back within a few days. You may see your results on MyChart before we have a chance to review them but we will give you a call once they are reviewed by [place]. If we ordered any referrals today, please let us know if you have not heard from their office within the next week.   Please try these tips to maintain a healthy lifestyle:  Eat most of your calories during the day when you are active. Eliminate processed foods including packaged sweets (pies, cakes, cookies), reduce intake of potatoes, white bread, white pasta, and white rice. Look for whole grain options, oat flour or almond flour.  Each meal should contain half fruits/vegetables, one quarter protein, and one quarter carbs (no bigger than a computer mouse).  Cut down on sweet beverages. This includes juice, soda, and sweet tea. Also watch fruit intake, though this is a healthier sweet option, it still contains natural sugar! Limit to 3 servings daily.  Drink at least 1 glass of water with each meal and aim for at least 8 glasses per day  Exercise at least 150 minutes every week.

## 2022-06-21 NOTE — Progress Notes (Signed)
Subjective:     Patient ID: Tracy Zamora, female    DOB: 09-07-1941, 81 y.o.   MRN: [id number]  Chief Complaint  Patient presents with   Burning with urination   Hematuria    Sx started Tuesday morning   Urinary Frequency    HPI Dysuria and hematuria since yesterday am +freq, incontinence, nocturia x6 and incont.     Health Maintenance Due  Topic Date Due   Hepatitis C Screening  Never done    Past Medical History:  Diagnosis Date   Alopecia    Back pain    lumbar. slipped disc years ago   Hx of colonic polyps 04/07/2018   Hyperlipidemia     Past Surgical History:  Procedure Laterality Date   uterine polypectomy     WISDOM TOOTH EXTRACTION      Outpatient Medications Prior to Visit  Medication Sig Dispense Refill   Acetaminophen (TYLENOL ARTHRITIS PAIN PO) Take 1 tablet by mouth daily.     Biotin 1000 MCG tablet Take 1,000 mcg by mouth daily.      Calcium Carb-Cholecalciferol 600-800 MG-UNIT TABS Take 1 tablet by mouth 2 (two) times daily.      Multiple Vitamin (MULTI-VITAMIN DAILY PO) Take by mouth.     meloxicam (MOBIC) 15 MG tablet Take 1 tablet (15 mg total) by mouth daily. (Patient not taking: Reported on 06/21/2022) 14 tablet 0   naproxen sodium (ALEVE) 220 MG tablet Take 220 mg by mouth. (Patient not taking: Reported on 06/21/2022)     No facility-administered medications prior to visit.    Allergies  Allergen Reactions   Penicillins     Causes UTI and ear infections    ROS neg/noncontributory except as noted HPI/below      Objective:     BP 118/60   Pulse 81   Temp (!) 97.5 F (36.4 C) (Temporal)   Ht 5' (1.524 m)   Wt 103 lb 8 oz (46.9 kg)   SpO2 98%   BMI 20.21 kg/m  Wt Readings from Last 3 Encounters:  06/21/22 103 lb 8 oz (46.9 kg)  06/07/22 102 lb (46.3 kg)  10/10/21 102 lb 12.8 oz (46.6 kg)    Physical Exam   Gen: WDWN NAD  HEENT: NCAT, conjunctiva not injected, sclera nonicteric CARDIAC: RRR, S1S2+ LUNGS: CTAB. No  wheezes ABDOMEN:  BS+, soft, NTND, No HSM, no masses. No cvat EXT:  no edema MSK: no gross abnormalities.  NEURO: A&O x3.  CN II-XII intact.  PSYCH: normal mood. Good eye contact  Results for orders placed or performed in visit on 06/21/22  POCT urinalysis dipstick  Result Value Ref Range   Color, UA YELLOW    Clarity, UA CLEAR    Glucose, UA Negative Negative   Bilirubin, UA NEGATIVE    Ketones, UA TRACE    Spec Grav, UA 1.020 1.010 - 1.025   Blood, UA POSITIVE    pH, UA 6.0 5.0 - 8.0   Protein, UA Positive (A) Negative   Urobilinogen, UA 0.2 0.2 or 1.0 E.U./dL   Nitrite, UA NEGATIVE    Leukocytes, UA Small (1+) (A) Negative   Appearance     Odor          Assessment & Plan:   Problem List Items Addressed This Visit   None Visit Diagnoses     Acute cystitis with hematuria    -  Primary   Dysuria       Relevant Orders  POCT urinalysis dipstick (Completed)   Urine Culture   Frequent urination       Relevant Orders   POCT urinalysis dipstick (Completed)   Urine Culture   Hematuria, unspecified type       Relevant Orders   POCT urinalysis dipstick (Completed)   Urine Culture      UTI w/hematuria-bactrim DS bid x 7d(worked well in past and no SE).  Drink plenty of water.  Check cx.  If worse, f/c, etc, call or ER  Meds ordered this encounter  Medications   sulfamethoxazole-trimethoprim (BACTRIM DS) 800-160 MG tablet    Sig: Take 1 tablet by mouth 2 (two) times daily.    Dispense:  14 tablet    Refill:  0    Wellington Hampshire, MD

## 2022-06-24 LAB — URINE CULTURE
MICRO NUMBER:: 13853236
SPECIMEN QUALITY:: ADEQUATE

## 2022-07-13 ENCOUNTER — Ambulatory Visit: Payer: Medicare Other | Admitting: Sports Medicine

## 2022-08-03 DIAGNOSIS — H52223 Regular astigmatism, bilateral: Secondary | ICD-10-CM | POA: Diagnosis not present

## 2022-08-03 DIAGNOSIS — H53143 Visual discomfort, bilateral: Secondary | ICD-10-CM | POA: Diagnosis not present

## 2022-08-03 DIAGNOSIS — H2513 Age-related nuclear cataract, bilateral: Secondary | ICD-10-CM | POA: Diagnosis not present

## 2022-08-03 DIAGNOSIS — H5213 Myopia, bilateral: Secondary | ICD-10-CM | POA: Diagnosis not present

## 2022-08-03 DIAGNOSIS — H524 Presbyopia: Secondary | ICD-10-CM | POA: Diagnosis not present

## 2022-08-15 DIAGNOSIS — D1801 Hemangioma of skin and subcutaneous tissue: Secondary | ICD-10-CM | POA: Diagnosis not present

## 2022-08-15 DIAGNOSIS — L298 Other pruritus: Secondary | ICD-10-CM | POA: Diagnosis not present

## 2022-08-15 DIAGNOSIS — L814 Other melanin hyperpigmentation: Secondary | ICD-10-CM | POA: Diagnosis not present

## 2022-08-15 DIAGNOSIS — L821 Other seborrheic keratosis: Secondary | ICD-10-CM | POA: Diagnosis not present

## 2022-08-15 DIAGNOSIS — L820 Inflamed seborrheic keratosis: Secondary | ICD-10-CM | POA: Diagnosis not present

## 2022-08-15 DIAGNOSIS — Z789 Other specified health status: Secondary | ICD-10-CM | POA: Diagnosis not present

## 2022-08-15 DIAGNOSIS — R208 Other disturbances of skin sensation: Secondary | ICD-10-CM | POA: Diagnosis not present

## 2022-10-26 ENCOUNTER — Ambulatory Visit (INDEPENDENT_AMBULATORY_CARE_PROVIDER_SITE_OTHER): Payer: Medicare HMO | Admitting: Family Medicine

## 2022-10-26 ENCOUNTER — Encounter: Payer: Self-pay | Admitting: Family Medicine

## 2022-10-26 VITALS — BP 110/70 | HR 80 | Temp 97.7°F | Ht 60.0 in | Wt 103.2 lb

## 2022-10-26 DIAGNOSIS — E785 Hyperlipidemia, unspecified: Secondary | ICD-10-CM

## 2022-10-26 DIAGNOSIS — Z0000 Encounter for general adult medical examination without abnormal findings: Secondary | ICD-10-CM | POA: Diagnosis not present

## 2022-10-26 DIAGNOSIS — M810 Age-related osteoporosis without current pathological fracture: Secondary | ICD-10-CM

## 2022-10-26 LAB — COMPREHENSIVE METABOLIC PANEL WITH GFR
ALT: 14 U/L (ref 0–35)
AST: 23 U/L (ref 0–37)
Albumin: 4.3 g/dL (ref 3.5–5.2)
Alkaline Phosphatase: 63 U/L (ref 39–117)
BUN: 15 mg/dL (ref 6–23)
CO2: 33 meq/L — ABNORMAL HIGH (ref 19–32)
Calcium: 10.2 mg/dL (ref 8.4–10.5)
Chloride: 97 meq/L (ref 96–112)
Creatinine, Ser: 0.68 mg/dL (ref 0.40–1.20)
GFR: 82.91 mL/min (ref >60.00)
Glucose, Bld: 97 mg/dL (ref 70–99)
Potassium: 4.3 meq/L (ref 3.5–5.1)
Sodium: 138 meq/L (ref 135–145)
Total Bilirubin: 0.7 mg/dL (ref 0.2–1.2)
Total Protein: 6.7 g/dL (ref 6.0–8.3)

## 2022-10-26 LAB — CBC WITH DIFFERENTIAL/PLATELET
Basophils Absolute: 0 10*3/uL (ref 0.0–0.1)
Basophils Relative: 0.4 % (ref 0.0–3.0)
Eosinophils Absolute: 0.1 10*3/uL (ref 0.0–0.7)
Eosinophils Relative: 0.6 % (ref 0.0–5.0)
HCT: 41.2 % (ref 36.0–46.0)
Hemoglobin: 14.1 g/dL (ref 12.0–15.0)
Lymphocytes Relative: 20.3 % (ref 12.0–46.0)
Lymphs Abs: 1.7 10*3/uL (ref 0.7–4.0)
MCHC: 34.1 g/dL (ref 30.0–36.0)
MCV: 92.6 fl (ref 78.0–100.0)
Monocytes Absolute: 0.7 10*3/uL (ref 0.1–1.0)
Monocytes Relative: 8.8 % (ref 3.0–12.0)
Neutro Abs: 5.8 10*3/uL (ref 1.4–7.7)
Neutrophils Relative %: 69.9 % (ref 43.0–77.0)
Platelets: 340 10*3/uL (ref 150.0–400.0)
RBC: 4.45 Mil/uL (ref 3.87–5.11)
RDW: 13.3 % (ref 11.5–15.5)
WBC: 8.4 10*3/uL (ref 4.0–10.5)

## 2022-10-26 LAB — LIPID PANEL
Cholesterol: 230 mg/dL — ABNORMAL HIGH (ref 0–200)
HDL: 78.6 mg/dL (ref >39.00)
LDL Cholesterol: 138 mg/dL — ABNORMAL HIGH (ref 0–99)
NonHDL: 150.99
Total CHOL/HDL Ratio: 3
Triglycerides: 65 mg/dL (ref 0.0–149.0)
VLDL: 13 mg/dL (ref 0.0–40.0)

## 2022-10-26 LAB — VITAMIN D 25 HYDROXY (VIT D DEFICIENCY, FRACTURES): VITD: 84.36 ng/mL (ref 30.00–100.00)

## 2022-10-26 NOTE — Progress Notes (Signed)
Phone [phone]   Subjective:  Patient presents today for their annual physical. Chief complaint-noted.   See problem oriented charting- ROS- full  review of systems was completed and negative except for: hearing loss unchanged, runny nose ongoing- usually in morning, light sensitivity, voice change with phlegm in morning, joint pain and low back pain  The following were reviewed and entered/updated in epic: Past Medical History:  Diagnosis Date   Alopecia    Back pain    lumbar. slipped disc years ago   Hx of colonic polyps 04/07/2018   Hyperlipidemia    Patient Active Problem List   Diagnosis Date Noted   Osteoporosis 03/29/2020    Priority: Medium    Alopecia 03/28/2018    Priority: Medium    Hyperlipidemia 03/28/2018    Priority: Medium    Hx of colonic polyps 04/07/2018    Priority: Low   Hearing loss 03/28/2018    Priority: Low   Hip pain 03/28/2018    Priority: Low   Past Surgical History:  Procedure Laterality Date   uterine polypectomy     WISDOM TOOTH EXTRACTION      Family History  Problem Relation Age of Onset   Prostate cancer Father        75   Stroke Father        mild stroke late 74s   Alzheimer's disease Mother        52   Healthy Brother    Colon cancer Neg Hx    Esophageal cancer Neg Hx    Rectal cancer Neg Hx    Stomach cancer Neg Hx     Medications- reviewed and updated Current Outpatient Medications  Medication Sig Dispense Refill   Acetaminophen (TYLENOL ARTHRITIS PAIN PO) Take 1 tablet by mouth daily.     Biotin 1000 MCG tablet Take 1,000 mcg by mouth daily.      Calcium Carb-Cholecalciferol 600-800 MG-UNIT TABS Take 1 tablet by mouth 2 (two) times daily.      Multiple Vitamin (MULTI-VITAMIN DAILY PO) Take by mouth.     No current facility-administered medications for this visit.    Allergies-reviewed and updated Allergies  Allergen Reactions   Penicillins     Causes UTI and ear infections     Social History    Social History Narrative   married, she has 3 children-2 sons one daughter.  6 grandkids.    She has a second home in [place].      Homemaking.       Hobbies: big exerciser   Objective  Objective:  BP 110/70   Pulse 80   Temp 97.7 F (36.5 C)   Ht 5' (1.524 m)   Wt 103 lb 3.2 oz (46.8 kg)   SpO2 97%   BMI 20.15 kg/m  Gen: NAD, resting comfortably HEENT: Mucous membranes are moist. Oropharynx normal Neck: no thyromegaly CV: RRR no murmurs rubs or gallops Lungs: CTAB no crackles, wheeze, rhonchi Abdomen: soft/nontender/nondistended/normal bowel sounds. No rebound or guarding.  Ext: no edema Skin: warm, dry Neuro: grossly normal, moves all extremities, PERRLA   Assessment and Plan   82 y.o. female presenting for annual physical.  Health Maintenance counseling: 1. Anticipatory guidance: Patient counseled regarding regular dental exams -q6 months, eye exams - yearly,  avoiding smoking and second hand smoke , limiting alcohol to 1 beverage per day- does not drink , no illicit drugs .   2. Risk factor reduction:  Advised patient of need for regular  exercise and diet rich and fruits and vegetables to reduce risk of heart attack and stroke.  Exercise-still walking regularly  for an hour and does weighted exercises 5 to 6 days a week also for an hour- 2 hours everyday.  Diet/weight management-weight up 1 pound from last physical-encourage stability or mild weight gain.  Wt Readings from Last 3 Encounters:  10/26/22 103 lb 3.2 oz (46.8 kg)  06/21/22 103 lb 8 oz (46.9 kg)  06/07/22 102 lb (46.3 kg)  3. Immunizations/screenings/ancillary studies-discussed Prevnar 20- chat about in 2026 per her preference, discussed Tdap at pharmacy, discussed Shingrix at pharmacy- opts out, opts out of covid Immunization History  Administered Date(s) Administered   PFIZER(Purple Top)SARS-COV-2 Vaccination 12/22/2019, 01/20/2020   Pneumococcal Polysaccharide-23 03/12/2020   4.  Cervical cancer screening- no recent history of abnormal pap smear- early 2000s was last time- no further workup planned now  5. Breast cancer screening-  plans to no longer do more mammograms  6. Colon cancer screening - 04/02/2018 and no repeat planned due to age and reassuring colonoscopy.   7. Skin cancer screening- within last year saw dermatology- biopsy benign. advised regular sunscreen use. Denies worrisome, changing, or new skin lesions.  8. Birth control/STD check- postmenopausal . Only active with husband 9. Osteoporosis screening at 23- see below 10. Smoking associated screening - Never smoker  Status of chronic or acute concerns   #Hair loss- TPO, TSH, T3, T4 normal last year. Still has issues- takes biotin. Has not mentioned to dermatology.   # Osteoporosis S: Last DEXA: 2021 with worst T-score -2.5 but had some body aches after this  Medication (bisphosphonate or prolia): None as concerned about side effects  Calcium: '1200mg'$  (through diet ok) recommended -taking Vitamin D: 1000 units a day recommended-takes vitamin D3 20 mcg in calcium tablet x2 and an extra 25 mcg Last vitamin D Lab Results  Component Value Date   VD25OH 90.63 10/10/2021  A/P: had planned on dexa but she states would not take meds- so wants to hold off on doing DEXA today.   -will check D to make sure not too high  #hyperlipidemia S: Medication:none  Lab Results  Component Value Date   CHOL 234 (H) 10/10/2021   HDL 80.50 10/10/2021   LDLCALC 138 (H) 10/10/2021   LDLDIRECT 161.3 01/31/2011   TRIG 77.0 10/10/2021   CHOLHDL 3 10/10/2021   A/P: Mildly elevated numbers-patient wants to avoid medication and CT calcium scoring at this time  #bruising much better off aleve   Recommended follow up: Return in about 1 year (around 10/27/2023) for physical or sooner if needed.Schedule b4 you leave.  Lab/Order associations: fasting   ICD-10-CM   1. Preventative health care  Z00.00     2. Age-related  osteoporosis without current pathological fracture  M81.0     3. Hyperlipidemia, unspecified hyperlipidemia type  E78.5       No orders of the defined types were placed in this encounter.   Return precautions advised.  Garret Reddish, MD

## 2022-10-26 NOTE — Patient Instructions (Addendum)
Tdap at pharmacy  Please stop by lab before you go If you have mychart- we will send your results within 3 business days of [place] receiving them.  If you do not have mychart- we will call you about results within 5 business days of [place] receiving them.  *please also note that you will see labs on mychart as soon as they post. I will later go in and write notes on them- will say "notes from Dr. Yong Channel"   Recommended follow up: Return in about 1 year (around 10/27/2023) for physical or sooner if needed.Schedule b4 you leave.

## 2022-12-22 ENCOUNTER — Encounter: Payer: Self-pay | Admitting: Nurse Practitioner

## 2022-12-22 ENCOUNTER — Telehealth: Payer: Self-pay | Admitting: Family Medicine

## 2022-12-22 ENCOUNTER — Ambulatory Visit (INDEPENDENT_AMBULATORY_CARE_PROVIDER_SITE_OTHER): Payer: Medicare HMO | Admitting: Nurse Practitioner

## 2022-12-22 VITALS — BP 110/60 | HR 84 | Temp 97.6°F

## 2022-12-22 DIAGNOSIS — N3001 Acute cystitis with hematuria: Secondary | ICD-10-CM

## 2022-12-22 DIAGNOSIS — R300 Dysuria: Secondary | ICD-10-CM | POA: Diagnosis not present

## 2022-12-22 LAB — POC URINALSYSI DIPSTICK (AUTOMATED)
Bilirubin, UA: NEGATIVE
Glucose, UA: NEGATIVE
Nitrite, UA: NEGATIVE
Protein, UA: POSITIVE — AB
Spec Grav, UA: 1.015 (ref 1.010–1.025)
Urobilinogen, UA: 0.2 E.U./dL
pH, UA: 5.5 (ref 5.0–8.0)

## 2022-12-22 MED ORDER — SULFAMETHOXAZOLE-TRIMETHOPRIM 800-160 MG PO TABS: 1.0000 | ORAL_TABLET | Freq: Two times a day (BID) | ORAL | 0 refills | Status: AC

## 2022-12-22 NOTE — Telephone Encounter (Signed)
Patient's husband states Patient is experiencing blood in urine, frequent urination, painful/burning sensation when urinating.  Transferred to Triage.

## 2022-12-22 NOTE — Telephone Encounter (Signed)
Please schedule ov for pt.  

## 2022-12-22 NOTE — Assessment & Plan Note (Signed)
UA indicative of urinary tract infection.  Will treat with Septra DS 1 tab twice daily for 3 days.  Pending urine culture.  Patient continue using the AZO/cranberry pills 1 more day for symptomatic relief.  Follow-up if no improvement

## 2022-12-22 NOTE — Patient Instructions (Signed)
Nice to see you today I will be in touch with the urine culture once I have it If you do not improve let us know

## 2022-12-22 NOTE — Assessment & Plan Note (Signed)
UA in office 

## 2022-12-22 NOTE — Progress Notes (Signed)
Acute Office Visit  Subjective:     Patient ID: Tracy Zamora, female    DOB: 1941-04-21, 82 y.o.   MRN: [id number]  Chief Complaint  Patient presents with   Hematuria    With burning and discomfort      Patient is in today for urinary complaints with a history of osteoporosis, HLD, colon polyps. Without a history of smoking   Symptoms started on Wednesday with frequency. States Thursday morning she was ok, still with frequency. States that last night and this morning she took some cranberry pills. States she is unsure if it helps States that she is drinking plenty of fluids   Review of Systems  Constitutional:  Negative for chills and fever.  Respiratory:  Negative for shortness of breath.   Cardiovascular:  Negative for chest pain.  Genitourinary:  Positive for dysuria, frequency and hematuria.  Neurological:  Negative for headaches.  Psychiatric/Behavioral:  Negative for hallucinations and suicidal ideas.         Objective:    BP 110/60   Pulse 84   Temp 97.6 F (36.4 C) (Temporal)   SpO2 96%    Physical Exam Vitals and nursing note reviewed.  Constitutional:      Appearance: Normal appearance.  Cardiovascular:     Rate and Rhythm: Normal rate and regular rhythm.     Heart sounds: Normal heart sounds.  Pulmonary:     Effort: Pulmonary effort is normal.     Breath sounds: Normal breath sounds.  Abdominal:     General: Bowel sounds are normal. There is no distension.     Palpations: There is no mass.     Tenderness: There is no abdominal tenderness. There is no right CVA tenderness or left CVA tenderness.     Hernia: No hernia is present.  Neurological:     Mental Status: She is alert.     Results for orders placed or performed in visit on 12/22/22  POCT Urinalysis Dipstick (Automated)  Result Value Ref Range   Color, UA yellow    Clarity, UA cloudy    Glucose, UA Negative Negative   Bilirubin, UA negative    Ketones, UA '5mg'$ /dl    Spec Grav,  UA 1.015 1.010 - 1.025   Blood, UA 3+    pH, UA 5.5 5.0 - 8.0   Protein, UA Positive (A) Negative   Urobilinogen, UA 0.2 0.2 or 1.0 E.U./dL   Nitrite, UA negative    Leukocytes, UA Large (3+) (A) Negative        Assessment & Plan:   Problem List Items Addressed This Visit       Genitourinary   Acute cystitis with hematuria    UA indicative of urinary tract infection.  Will treat with Septra DS 1 tab twice daily for 3 days.  Pending urine culture.  Patient continue using the AZO/cranberry pills 1 more day for symptomatic relief.  Follow-up if no improvement      Relevant Medications   sulfamethoxazole-trimethoprim (BACTRIM DS) 800-160 MG tablet   Other Relevant Orders   Urine Culture     Other   Dysuria - Primary    UA in office.      Relevant Orders   POCT Urinalysis Dipstick (Automated) (Completed)    Meds ordered this encounter  Medications   sulfamethoxazole-trimethoprim (BACTRIM DS) 800-160 MG tablet    Sig: Take 1 tablet by mouth 2 (two) times daily for 3 days.    Dispense:  6 tablet  Refill:  0    Order Specific Question:   Supervising Provider    Answer:   Loura Pardon A [[id number]]    Return if symptoms worsen or fail to improve.  Romilda Garret, NP

## 2022-12-22 NOTE — Telephone Encounter (Signed)
[facility] full - pt seeing provider at [facility]- awaiting full triage notes

## 2022-12-22 NOTE — Telephone Encounter (Signed)
[facility]   Patient Name: Tracy Zamora Gender: Female DOB: 1941-08-06 Age: 82 Y 58 M 12 D Return Phone Number: [phone] (Primary), [phone] (Secondary) Address: City/ State/ Zip: [place]  [address] Client [facility] Provider Garret Reddish- MD Contact Type Call Who Is Calling Patient / Member / Family / Caregiver Call Type Triage / Clinical Caller Name Alice [facility] Relationship To Patient Spouse Return Phone Number [phone] (Secondary) Chief Complaint Urination Pain Reason for Call Symptomatic / Request for [place] states a pt has blood in urine, frequent urination , and painful urination . Translation No Nurse Assessment Nurse: Patsey Berthold, RN, Roma Kayser Date/Time Eilene Ghazi Time): 12/22/2022 9:28:09 AM Confirm and document reason for call. If symptomatic, describe symptoms. ---Caller states a pt has blood in urine, frequent urination , and painful urination. Onset of symptoms last day or two, was up last night almost every hour with frequency of urination and onset of blood mixed urine last night. Does the patient have any new or worsening symptoms? ---Yes Will a triage be completed? ---Yes Related visit to physician within the last 2 weeks? ---No Does the PT have any chronic conditions? (i.e. diabetes, asthma, this includes High risk factors for pregnancy, etc.) ---No Is this a behavioral health or substance abuse call? ---No Guidelines Guideline Title Affirmed Question Affirmed Notes Nurse Date/Time (Eastern Time) Urination Pain - Female Blood in urine (red, pink, or tea-colored) Patsey Berthold, RN, Roma Kayser 12/22/2022 9:29:31 AM Disp. Time Eilene Ghazi Time) Disposition Final User 12/22/2022 9:45:57 AM See PCP within 24 Hours Yes Patsey Berthold, RN, Roma Kayser PLEASE NOTE: All timestamps  contained within this report are represented as [place] Standard Time. CONFIDENTIALTY NOTICE: This fax transmission is intended only for the addressee. It contains information that is legally privileged, confidential or otherwise protected from use or disclosure. If you are not the intended recipient, you are strictly prohibited from reviewing, disclosing, copying using or disseminating any of this information or taking any action in reliance on or regarding this information. If you have received this fax in error, please notify us immediately by telephone so that we can arrange for its return to [place]. Phone: [phone], Toll-Free: [phone], Fax: [phone] Page: 2 of 2 Call Id: [id number] Final Disposition 12/22/2022 9:45:57 AM See PCP within 24 Hours Yes Patsey Berthold, RN, Melvyn Novas Disagree/Comply Comply Caller Understands Yes PreDisposition Call Doctor Care Advice Given Per Guideline SEE PCP WITHIN 24 HOURS: * IF OFFICE WILL BE OPEN: You need to be examined within the next 24 hours. Call your doctor (or NP/PA) when the office opens and make an appointment. REASSURANCE AND EDUCATION - POSSIBLE URINE INFECTION: * This could be an urinary tract infection. * You should see your doctor (or NP/PA) to be examined and tested. DRINK EXTRA FLUIDS: * Drink extra fluids. * Drink 8 to 10 cups (1,800 to 2,400 ml) of liquids a day. * Reason: This will water-down your urine and make it less painful to pass. It will also help wash out any germs that may be in your bladder. CRANBERRY JUICE: * Some people think that drinking cranberry juice helps fight off urinary tract infections. CALL BACK IF: * You become worse CARE ADVICE given per Urination Pain - Female (Adult) guideline. Comments User: Diana Eves, RN Date/Time Eilene Ghazi Time): 12/22/2022 9:34:41 AM Has increased fluids and is taking cranberry capsules. User: Diana Eves, RN Date/Time Eilene Ghazi  Time): 12/22/2022 9:45:56 AM Caller scheduled  with [facility], [place] Referrals REFERRED TO PCP OFFIC

## 2022-12-24 LAB — URINE CULTURE
MICRO NUMBER:: 14637913
SPECIMEN QUALITY:: ADEQUATE

## 2022-12-25 ENCOUNTER — Telehealth: Payer: Self-pay | Admitting: Family Medicine

## 2022-12-25 NOTE — Telephone Encounter (Signed)
Pt called returning Kelly's missed call. Told pt Cable's response regarding lab results. Pt stated she was doing fine, she'll be done with meds come tomorrow, 3/5. Pt had no questions/concerns. Call back # [phone]

## 2023-11-08 ENCOUNTER — Encounter: Payer: Medicare HMO | Admitting: Family Medicine

## 2024-05-02 ENCOUNTER — Encounter: Payer: Self-pay | Admitting: Family Medicine

## 2024-05-02 ENCOUNTER — Ambulatory Visit: Payer: Self-pay | Admitting: Family Medicine

## 2024-05-02 ENCOUNTER — Ambulatory Visit (INDEPENDENT_AMBULATORY_CARE_PROVIDER_SITE_OTHER): Payer: Medicare HMO | Admitting: Family Medicine

## 2024-05-02 VITALS — BP 112/70 | HR 72 | Ht 60.0 in | Wt 101.0 lb

## 2024-05-02 DIAGNOSIS — M810 Age-related osteoporosis without current pathological fracture: Secondary | ICD-10-CM | POA: Diagnosis not present

## 2024-05-02 DIAGNOSIS — E785 Hyperlipidemia, unspecified: Secondary | ICD-10-CM | POA: Diagnosis not present

## 2024-05-02 DIAGNOSIS — Z0000 Encounter for general adult medical examination without abnormal findings: Secondary | ICD-10-CM

## 2024-05-02 LAB — CBC WITH DIFFERENTIAL/PLATELET
Basophils Absolute: 0 K/uL (ref 0.0–0.1)
Basophils Relative: 0.4 % (ref 0.0–3.0)
Eosinophils Absolute: 0.1 K/uL (ref 0.0–0.7)
Eosinophils Relative: 0.8 % (ref 0.0–5.0)
HCT: 40.9 % (ref 36.0–46.0)
Hemoglobin: 13.8 g/dL (ref 12.0–15.0)
Lymphocytes Relative: 18.6 % (ref 12.0–46.0)
Lymphs Abs: 1.3 K/uL (ref 0.7–4.0)
MCHC: 33.8 g/dL (ref 30.0–36.0)
MCV: 91.3 fl (ref 78.0–100.0)
Monocytes Absolute: 0.7 K/uL (ref 0.1–1.0)
Monocytes Relative: 10 % (ref 3.0–12.0)
Neutro Abs: 4.9 K/uL (ref 1.4–7.7)
Neutrophils Relative %: 70.2 % (ref 43.0–77.0)
Platelets: 304 K/uL (ref 150.0–400.0)
RBC: 4.48 Mil/uL (ref 3.87–5.11)
RDW: 12.9 % (ref 11.5–15.5)
WBC: 7 K/uL (ref 4.0–10.5)

## 2024-05-02 LAB — COMPREHENSIVE METABOLIC PANEL WITH GFR
ALT: 11 U/L (ref 0–35)
AST: 23 U/L (ref 0–37)
Albumin: 4.3 g/dL (ref 3.5–5.2)
Alkaline Phosphatase: 67 U/L (ref 39–117)
BUN: 12 mg/dL (ref 6–23)
CO2: 31 meq/L (ref 19–32)
Calcium: 9.5 mg/dL (ref 8.4–10.5)
Chloride: 96 meq/L (ref 96–112)
Creatinine, Ser: 0.68 mg/dL (ref 0.40–1.20)
GFR: 82.03 mL/min (ref >60.00)
Glucose, Bld: 98 mg/dL (ref 70–99)
Potassium: 4.2 meq/L (ref 3.5–5.1)
Sodium: 133 meq/L — ABNORMAL LOW (ref 135–145)
Total Bilirubin: 0.7 mg/dL (ref 0.2–1.2)
Total Protein: 7 g/dL (ref 6.0–8.3)

## 2024-05-02 LAB — LIPID PANEL
Cholesterol: 216 mg/dL — ABNORMAL HIGH (ref 0–200)
HDL: 78.5 mg/dL (ref >39.00)
LDL Cholesterol: 127 mg/dL — ABNORMAL HIGH (ref 0–99)
NonHDL: 137.13
Total CHOL/HDL Ratio: 3
Triglycerides: 50 mg/dL (ref 0.0–149.0)
VLDL: 10 mg/dL (ref 0.0–40.0)

## 2024-05-02 LAB — VITAMIN D 25 HYDROXY (VIT D DEFICIENCY, FRACTURES): VITD: 91.81 ng/mL (ref 30.00–100.00)

## 2024-05-02 NOTE — Progress Notes (Signed)
 Phone [phone]   Subjective:  Patient presents today for their annual physical. Chief complaint-noted.   See problem oriented charting- ROS- full  review of systems was completed and negative Per full ROS sheet completed by patient except for topics noted under acute/chronic concerns  The following were reviewed and entered/updated in epic: Past Medical History:  Diagnosis Date   Allergy    Alopecia    Arthritis    Back pain    lumbar. slipped disc years ago   Cataract    Hx of colonic polyps 04/07/2018   Hyperlipidemia    Patient Active Problem List   Diagnosis Date Noted   Osteoporosis 03/29/2020    Priority: Medium    Alopecia 03/28/2018    Priority: Medium    Hyperlipidemia 03/28/2018    Priority: Medium    Hx of colonic polyps 04/07/2018    Priority: Low   Hearing loss 03/28/2018    Priority: Low   Hip pain 03/28/2018    Priority: Low   Acute cystitis with hematuria 12/22/2022   Dysuria 12/22/2022   Past Surgical History:  Procedure Laterality Date   uterine polypectomy     WISDOM TOOTH EXTRACTION      Family History  Problem Relation Age of Onset   Prostate cancer Father        51   Stroke Father        mild stroke late 91s   Alzheimer's disease Mother        47   Healthy Brother    Colon cancer Neg Hx    Esophageal cancer Neg Hx    Rectal cancer Neg Hx    Stomach cancer Neg Hx     Medications- reviewed and updated Current Outpatient Medications  Medication Sig Dispense Refill   Biotin 1000 MCG tablet Take 1,000 mcg by mouth daily.      Calcium Carb-Cholecalciferol 600-800 MG-UNIT TABS Take 1 tablet by mouth 2 (two) times daily.      Multiple Vitamin (MULTI-VITAMIN DAILY PO) Take by mouth.     No current facility-administered medications for this visit.    Allergies-reviewed and updated Allergies  Allergen Reactions   Penicillins     Causes UTI and ear infections     Social History   Social History Narrative   married, she has  3 children-2 sons one daughter.  6 grandkids.    She has a second home in [place] .      Homemaking.       Hobbies: big exerciser   Objective  Objective:  BP 112/70   Pulse 72   Ht 5' (1.524 m)   Wt 101 lb (45.8 kg)   SpO2 97%   BMI 19.73 kg/m  Gen: NAD, resting comfortably HEENT: Mucous membranes are moist. Oropharynx normal. Bilateral cerumen- unable to remove with curette on the left- on right was less substantial- views of tympanic membrane still on both sides though limited Neck: no thyromegaly CV: RRR no murmurs rubs or gallops Lungs: CTAB no crackles, wheeze, rhonchi Abdomen: soft/nontender/nondistended/normal bowel sounds. No rebound or guarding.  Ext: no edema Skin: warm, dry Neuro: grossly normal, moves all extremities, PERRLA   Assessment and Plan   83 y.o. female presenting for annual physical.  Health Maintenance counseling: 1. Anticipatory guidance: Patient counseled regarding regular dental exams -q6 months, eye exams - yearly for cataract monitoring,  avoiding smoking and second hand smoke , limiting alcohol to 1 beverage per day- doesn't drink , no illicit  drugs.   2. Risk factor reduction:  Advised patient of need for regular exercise and diet rich and fruits and vegetables to reduce risk of heart attack and stroke.  Exercise- still doing great job walking for an hour aily plus weighted exercise 5-6 days a week for an hour.  Diet/weight management-reasonably healthy diet- encouraged no further weight loss.  Wt Readings from Last 3 Encounters:  05/02/24 101 lb (45.8 kg)  10/26/22 103 lb 3.2 oz (46.8 kg)  06/21/22 103 lb 8 oz (46.9 kg)  3. Immunizations/screenings/ancillary studies-offered Prevnar 20, Shingrix- opts out , Tetanus, Diphtheria, and Pertussis (Tdap)- may do at pharmacy  Immunization History  Administered Date(s) Administered   PFIZER(Purple Top)SARS-COV-2 Vaccination 12/22/2019, 01/20/2020   Pneumococcal Polysaccharide-23  03/12/2020    4. Cervical cancer screening- no recent history of abnormal pap smear- early 2000s was last time- no further workup planned now . No vaginal discharge or bleeding- opts out of further exam 5. Breast cancer screening-  plans to no longer do more mammograms . No issues on self exam 6. Colon cancer screening - 04/02/2018 and no repeat planned due to age and reassuring colonoscopy.   7. Skin cancer screening- within last year saw dermatology. advised regular sunscreen use. Denies worrisome, changing, or new skin lesions.  8. Birth control/STD check- postmenopausal . Only active with husband 9. Osteoporosis screening at 65- see below 10. Smoking associated screening - Never smoker  Status of chronic or acute concerns   #varicose veins- has had long time but getting worse. Not getting pain just more visible. Does not wear compression stockings -recommended compression stocking use regularly and if starts to have discomfort despite this could refer to vascular surgery team for their opinion   #hair loss last year- labs reassuring. Not worsening. Still on biotin. Could mention to dermatology   # Osteoporosis S: Last DEXA: 20201 with worst to score -2.5  Medication (bisphosphonate or prolia): none as concerned abotu side effects  Calcium: 1200mg  (through diet ok) recommended - taking Vitamin D : 1000 units a day recommended- taking Last vitamin D  Lab Results  Component Value Date   VD25OH 84.36 10/26/2022  A/P: offered DEXA- declines today- reconsider each year . Update D as high normal- she cut down total dose  #hyperlipidemia S: Medication:none  Lab Results  Component Value Date   CHOL 230 (H) 10/26/2022   HDL 78.60 10/26/2022   LDLCALC 138 (H) 10/26/2022   LDLDIRECT 161.3 01/31/2011   TRIG 65.0 10/26/2022   CHOLHDL 3 10/26/2022   A/P: discussed medicine vs CT calcium- wants to decline both with age  Recommended follow up: Return in about 1 year (around 05/02/2025) for  physical or sooner if needed.Schedule b4 you leave.  Lab/Order associations: fasting   ICD-10-CM   1. Preventative health care  Z00.00     2. Hyperlipidemia, unspecified hyperlipidemia type  E78.5 Comprehensive metabolic panel with GFR    CBC with Differential/Platelet    Lipid panel    3. Age-related osteoporosis without current pathological fracture  M81.0 VITAMIN D  25 Hydroxy (Vit-D Deficiency, Fractures)      No orders of the defined types were placed in this encounter.   Return precautions advised.  Garnette Lukes, MD

## 2024-05-02 NOTE — Patient Instructions (Addendum)
 Please stop by lab before you go If you have mychart- we will send your results within 3 business days of us  receiving them.  If you do not have mychart- we will call you about results within 5 business days of us  receiving them.  *please also note that you will see labs on mychart as soon as they post. I will later go in and write notes on them- will say notes from Dr. Katrinka   -recommended compression stocking use regularly and if starts to have discomfort despite this could refer to vascular surgery team for their opinion   Consider Tetanus, Diphtheria, and Pertussis (Tdap) at pharmacy or Prevnar 20 for pneumonia if you change your mind  Mineral oil for ear full of wax OR can buy debrox OTC (available over the counter without a prescription)  Purchase mineral oil from laxative aisle Lay down on your side with ear that is bothering you facing up Use 3-4 drops with a dropper and place in ear for 30 seconds Place cotton swab outside of ear Turn to other side and allow this to drain Repeat 3-4 x a day Return to see us  if not improving within a few days  Recommended follow up: Return in about 1 year (around 05/02/2025) for physical or sooner if needed.Schedule b4 you leave.

## 2024-11-27 ENCOUNTER — Telehealth: Payer: Self-pay | Admitting: Family Medicine

## 2024-11-27 NOTE — Telephone Encounter (Signed)
 LVM to r/s appt from 05/04/25 for physical

## 2025-05-04 ENCOUNTER — Encounter: Admitting: Family Medicine
